# Patient Record
Sex: Male | Born: 1945 | Race: Black or African American | Hispanic: No | Marital: Married | Smoking: Former smoker
Health system: Southern US, Community
[De-identification: ages and names within clinical notes are randomized; demographics above are authoritative.]

## PROBLEM LIST (undated history)

## (undated) DIAGNOSIS — Z8719 Personal history of other diseases of the digestive system: Secondary | ICD-10-CM

## (undated) DIAGNOSIS — I10 Essential (primary) hypertension: Secondary | ICD-10-CM

## (undated) DIAGNOSIS — Z8546 Personal history of malignant neoplasm of prostate: Secondary | ICD-10-CM

## (undated) HISTORY — DX: Personal history of other diseases of the digestive system: Z87.19

---

## 1998-12-27 ENCOUNTER — Encounter: Payer: Self-pay | Admitting: Emergency Medicine

## 1998-12-27 ENCOUNTER — Emergency Department (HOSPITAL_COMMUNITY): Admission: EM | Admit: 1998-12-27 | Discharge: 1998-12-27 | Payer: Self-pay | Admitting: Emergency Medicine

## 2006-10-30 ENCOUNTER — Encounter: Payer: Self-pay | Admitting: Internal Medicine

## 2006-10-30 ENCOUNTER — Ambulatory Visit: Payer: Self-pay | Admitting: Internal Medicine

## 2006-10-30 ENCOUNTER — Ambulatory Visit (HOSPITAL_COMMUNITY): Admission: RE | Admit: 2006-10-30 | Discharge: 2006-10-30 | Payer: Self-pay | Admitting: Internal Medicine

## 2008-11-15 ENCOUNTER — Emergency Department (HOSPITAL_COMMUNITY): Admission: EM | Admit: 2008-11-15 | Discharge: 2008-11-15 | Payer: Self-pay | Admitting: Emergency Medicine

## 2009-05-07 HISTORY — PX: PROSTATE SURGERY: SHX751

## 2009-09-06 ENCOUNTER — Ambulatory Visit (HOSPITAL_COMMUNITY): Admission: RE | Admit: 2009-09-06 | Discharge: 2009-09-06 | Payer: Self-pay | Admitting: Urology

## 2009-11-14 ENCOUNTER — Inpatient Hospital Stay (HOSPITAL_COMMUNITY): Admission: RE | Admit: 2009-11-14 | Discharge: 2009-11-15 | Payer: Self-pay | Admitting: Urology

## 2009-11-14 ENCOUNTER — Encounter (INDEPENDENT_AMBULATORY_CARE_PROVIDER_SITE_OTHER): Payer: Self-pay | Admitting: Urology

## 2010-07-23 LAB — CBC
Hemoglobin: 14.6 g/dL (ref 13.0–17.0)
MCH: 28 pg (ref 26.0–34.0)
MCV: 81.8 fL (ref 78.0–100.0)
RBC: 5.22 MIL/uL (ref 4.22–5.81)

## 2010-07-23 LAB — HEMOGLOBIN AND HEMATOCRIT, BLOOD
HCT: 35.4 % — ABNORMAL LOW (ref 39.0–52.0)
Hemoglobin: 12 g/dL — ABNORMAL LOW (ref 13.0–17.0)
Hemoglobin: 13 g/dL (ref 13.0–17.0)

## 2010-07-23 LAB — BASIC METABOLIC PANEL
CO2: 28 mEq/L (ref 19–32)
Chloride: 101 mEq/L (ref 96–112)
GFR calc Af Amer: >60 mL/min (ref >60)
Sodium: 137 mEq/L (ref 135–145)

## 2010-07-23 LAB — ABO/RH: ABO/RH(D): O POS

## 2010-07-23 LAB — SURGICAL PCR SCREEN
MRSA, PCR: NEGATIVE
Staphylococcus aureus: NEGATIVE

## 2010-08-13 LAB — CBC
MCV: 80.7 fL (ref 78.0–100.0)
Platelets: 184 10*3/uL (ref 150–400)
WBC: 8.5 10*3/uL (ref 4.0–10.5)

## 2010-08-13 LAB — POCT CARDIAC MARKERS: Myoglobin, poc: 43.4 ng/mL (ref 12–200)

## 2010-08-13 LAB — DIFFERENTIAL
Basophils Absolute: 0 10*3/uL (ref 0.0–0.1)
Eosinophils Absolute: 0.2 10*3/uL (ref 0.0–0.7)
Lymphs Abs: 1.6 10*3/uL (ref 0.7–4.0)
Neutrophils Relative %: 71 % (ref 43–77)

## 2010-08-13 LAB — BASIC METABOLIC PANEL
BUN: 8 mg/dL (ref 6–23)
Creatinine, Ser: 1.02 mg/dL (ref 0.4–1.5)
GFR calc non Af Amer: >60 mL/min (ref >60)

## 2010-09-10 IMAGING — CT CT HEAD W/O CM
1 series · 16 of 30 positions shown, 20 images · non-contrast
Comparison: None.

CLINICAL DATA: Dizzy.

CT HEAD WITHOUT CONTRAST
TECHNIQUE: Contiguous axial images were obtained from the base of
the skull through the vertex without contrast.

[Series 2: headseq 4.8 h37s · axial · 0.43mm/px · z∈[+80,+215]mm · 16 of 30 slices shown, 20 images]
[im 2/30  brain]
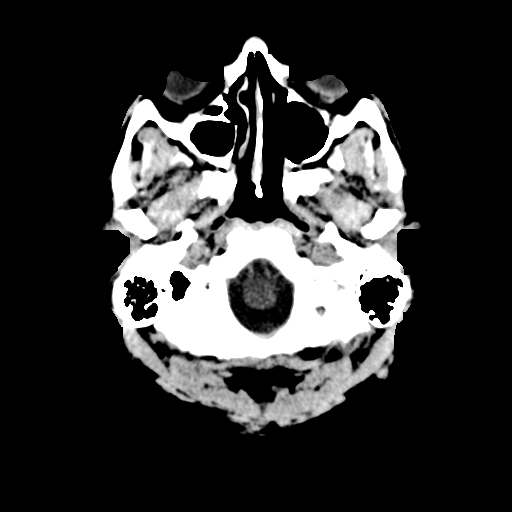
[im 2/30  bone]
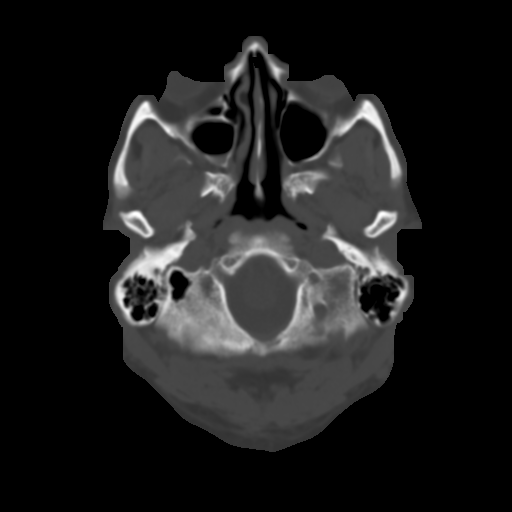
[im 4/30  brain]
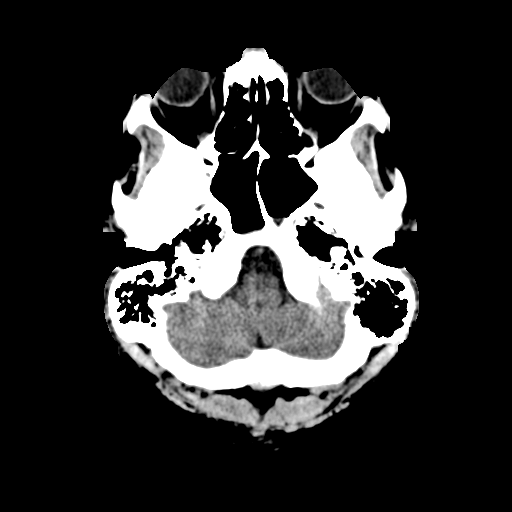
[im 6/30  brain]
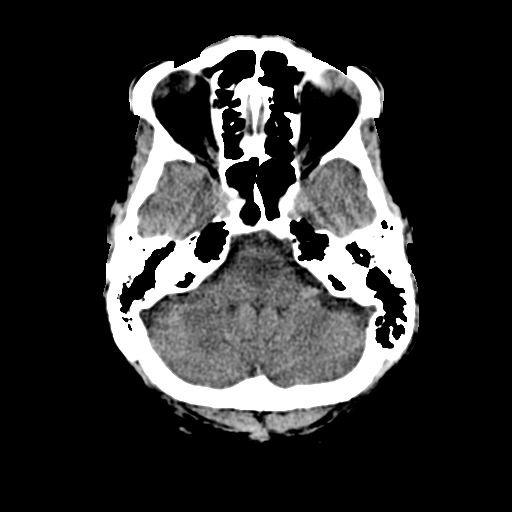
[im 8/30  brain]
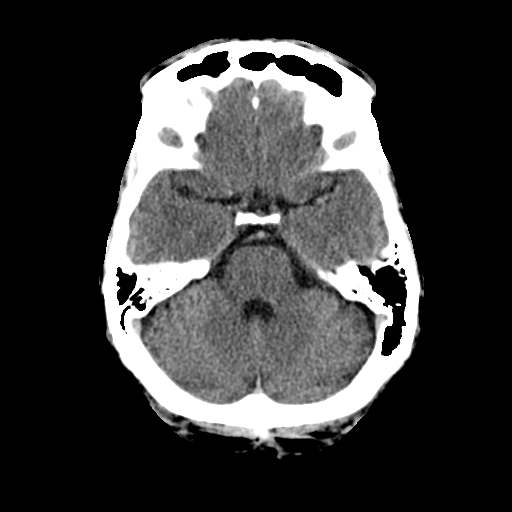
[im 9/30  brain]
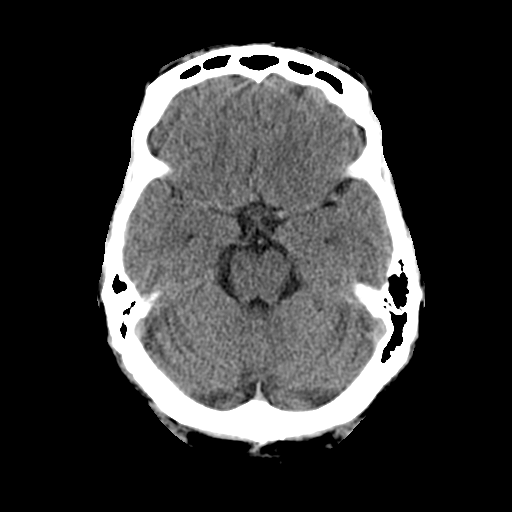
[im 9/30  bone]
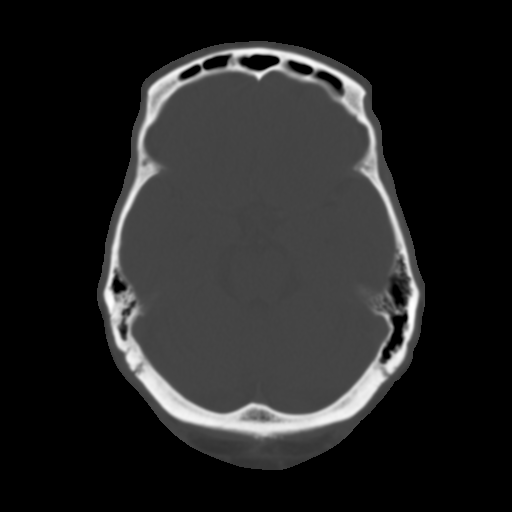
[im 11/30  brain]
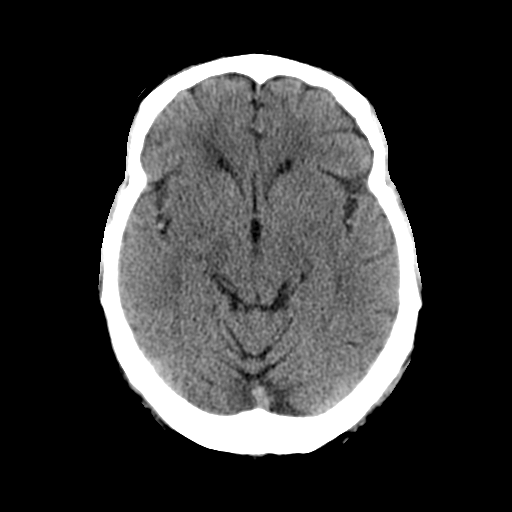
[im 13/30  brain]
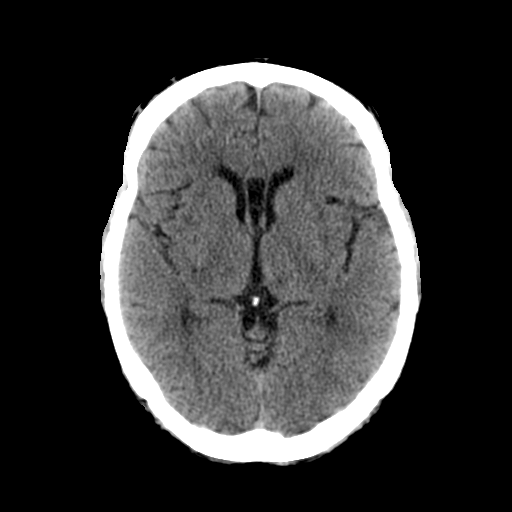
[im 15/30  brain]
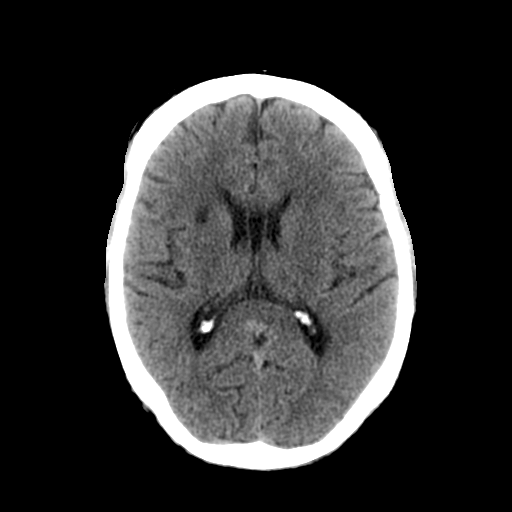
[im 16/30  brain]
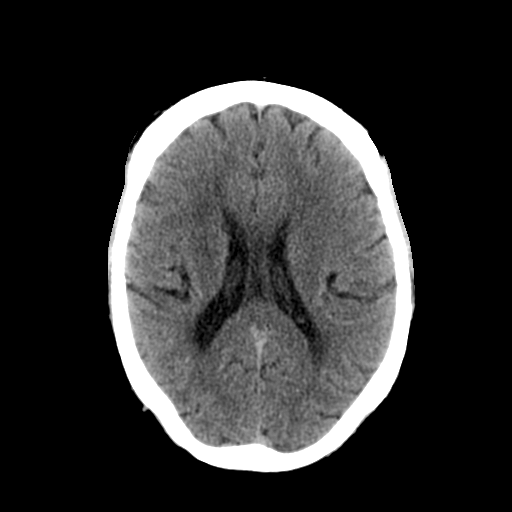
[im 16/30  bone]
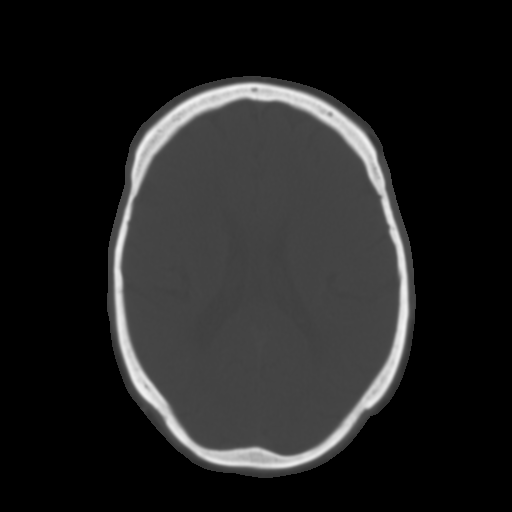
[im 18/30  brain]
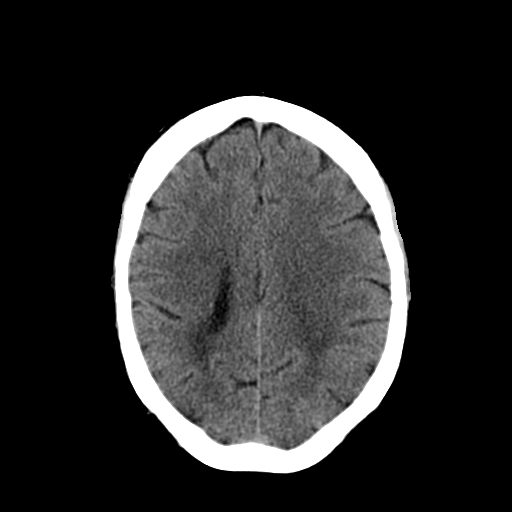
[im 20/30  brain]
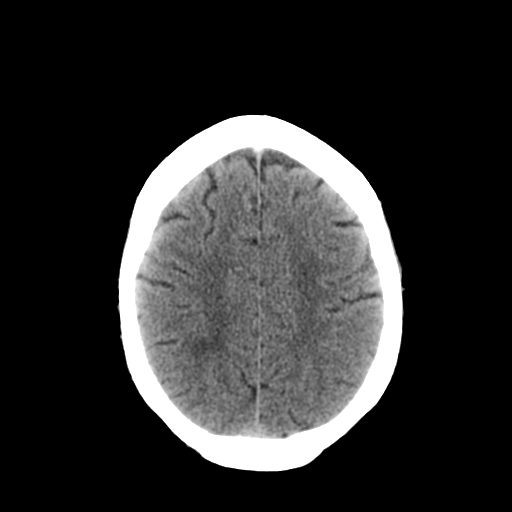
[im 22/30  brain]
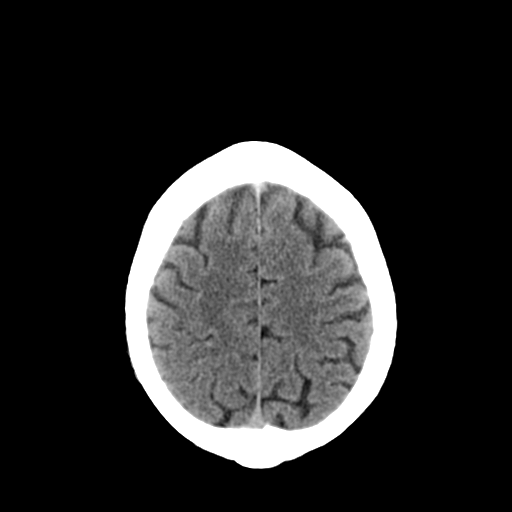
[im 23/30  brain]
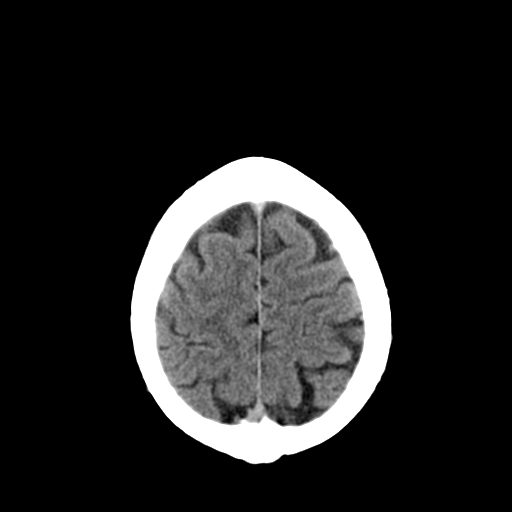
[im 23/30  bone]
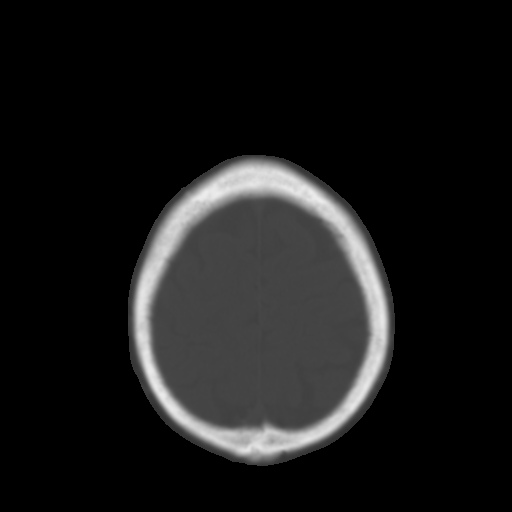
[im 25/30  brain]
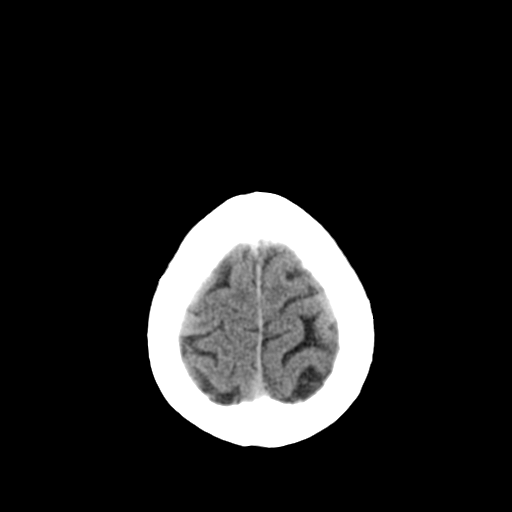
[im 27/30  brain]
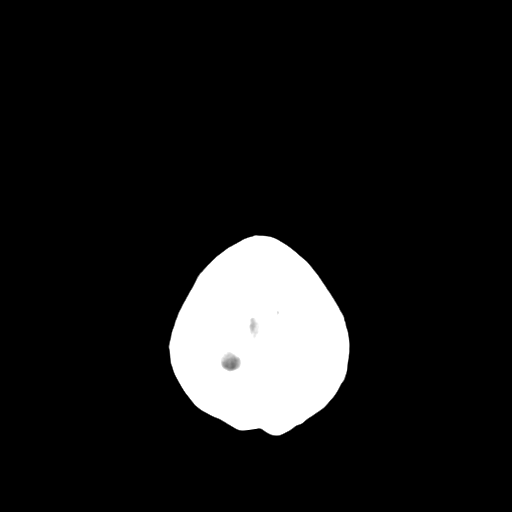
[im 29/30  brain]
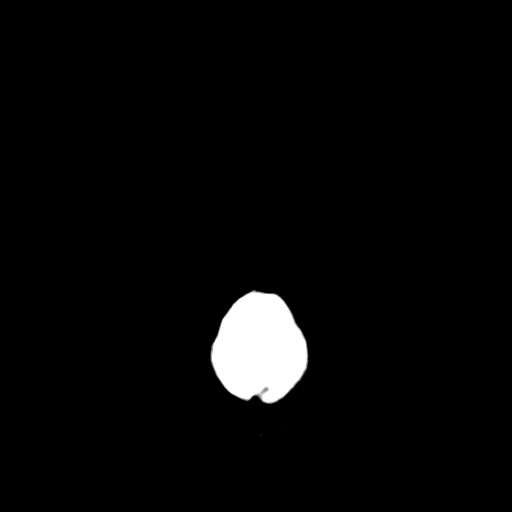

[16 of 30 positions shown; findings below may reference images not displayed]

FINDINGS: No intracranial hemorrhage. Cava septum lucent
incidentally noted.  Mild age related atrophy without
hydrocephalus.

Anterior right corona radiata and bilateral thalamic hypodensities
suggestive of result of infarcts, age indeterminate.

Hypodensity right parietal region probably related to result of
small vessel disease.  Less notable white matter type changes
paraventricular region. No intracranial mass detected on this
unenhanced exam.

Mild mucosal thickening right maxillary sinus and sphenoid sinus
air cells with small air-fluid level.
IMPRESSION: No intracranial hemorrhage.

Infarcts within the thalami and anterior right corona radiata, age
indeterminate.

Small vessel disease type changes most notable right parietal
region.

Very mild paranasal sinus disease.

## 2010-09-19 NOTE — Op Note (Signed)
NAME:  Antonio Hughes, Antonio Hughes          ACCOUNT NO.:  [id number]   MEDICAL RECORD NO.:  [id number]          PATIENT TYPE:  AMB   LOCATION:  DAY                           FACILITY:  [facility]   PHYSICIAN:  R. Roetta Sessions, M.D. DATE OF BIRTH:  [birth year]   DATE OF PROCEDURE:  10/30/2006  DATE OF DISCHARGE:                               OPERATIVE REPORT   PROCEDURE:  Screening colonoscopy/colonoscopy with biopsy.   INDICATIONS FOR PROCEDURE:  The patient is a pleasant 65 year old  African American sent over at the courtesy Dr. Sudie Bailey, who has no  lower GI tract symptoms, for screening colonoscopy.  Antonio Hughes has  never had his lower GI tract evaluated.  There is no family history of  colorectal neoplasia.  Colonoscopy is now being done as a screening  maneuver.  This approach has been discussed with the patient at length.  Potential risks, benefits and alternatives have been reviewed.  His  questions were answered and he is agreeable.  Please see documentation  in the medical record.   PROCEDURE NOTE:  O2 saturation, blood pressure, pulse and respirations  were monitored throughout the entire procedure.   CONSCIOUS SEDATION:  Versed 2 mg IV, Demerol 50 mg IV.   INSTRUMENT:  Pentax video chip system.   FINDINGS:  Digital rectal exam revealed no abnormalities.   ENDOSCOPIC FINDINGS:  The prep was excellent.  Examination of the  colonic mucosa was undertaken from the rectosigmoid junction through the  left transverse, right colon to the area of the appendiceal orifice,  ileocecal valve and cecum.  These structures well seen and photographed  for the record.  From this level, the scope was slowly withdrawn.  All  previously mentioned mucosal surfaces were again seen.  The patient had  a diminutive (2 mm) polyp in the mid ascending colon and a second one in  the mid descending colon, both of these were cold biopsy/removed.  The  patient had a few scattered shallow mouth diverticula in the  sigmoid  colon, remainder of the colonic mucosa appeared normal.  The scope was  pulled down in the rectum where thorough examination of the rectal  mucosa including retroflexed view of the anal verge demonstrated no  abnormalities.  The patient tolerated procedure well as reactive to  endoscopy.   IMPRESSION:  1. Normal rectum.  2. A few scattered shallow sigmoid diverticula.  3. Two diminutive polyps, one each in the left and ascending segments,      cold biopsy/removed.  Remainder colonic mucosa appeared normal.   RECOMMENDATIONS:  1. Diverticulosis literature provided to Antonio Hughes  2. Follow-up on path.  3. Further recommendations to follow.      Jonathon Bellows, M.D.  Electronically Signed     RMR/MEDQ  D:  10/30/2006  T:  10/30/2006  Job:  [id number]   cc:   Mila Homer. Sudie Bailey, M.D.  Fax: [phone]

## 2011-10-15 ENCOUNTER — Encounter: Payer: Self-pay | Admitting: Internal Medicine

## 2012-03-27 ENCOUNTER — Other Ambulatory Visit (HOSPITAL_COMMUNITY)
Admission: RE | Admit: 2012-03-27 | Discharge: 2012-03-27 | Disposition: A | Discharge status: Home / Self Care | Payer: Medicare Other | Source: Ambulatory Visit | Attending: Otolaryngology | Admitting: Otolaryngology

## 2012-03-27 ENCOUNTER — Ambulatory Visit (INDEPENDENT_AMBULATORY_CARE_PROVIDER_SITE_OTHER): Payer: Medicare Other | Admitting: Otolaryngology

## 2012-03-27 DIAGNOSIS — L918 Other hypertrophic disorders of the skin: Secondary | ICD-10-CM | POA: Insufficient documentation

## 2012-03-27 DIAGNOSIS — K0381 Cracked tooth: Secondary | ICD-10-CM | POA: Insufficient documentation

## 2012-03-27 DIAGNOSIS — D3701 Neoplasm of uncertain behavior of lip: Secondary | ICD-10-CM

## 2012-03-27 DIAGNOSIS — K1370 Unspecified lesions of oral mucosa: Secondary | ICD-10-CM | POA: Insufficient documentation

## 2012-11-09 ENCOUNTER — Encounter (HOSPITAL_COMMUNITY): Payer: Self-pay

## 2012-11-09 ENCOUNTER — Emergency Department (HOSPITAL_COMMUNITY)
Admission: EM | Admit: 2012-11-09 | Discharge: 2012-11-09 | Disposition: A | Discharge status: Home / Self Care | Payer: Medicare Other | Attending: Emergency Medicine | Admitting: Emergency Medicine

## 2012-11-09 DIAGNOSIS — Z859 Personal history of malignant neoplasm, unspecified: Secondary | ICD-10-CM | POA: Insufficient documentation

## 2012-11-09 DIAGNOSIS — R590 Localized enlarged lymph nodes: Secondary | ICD-10-CM

## 2012-11-09 DIAGNOSIS — Z9889 Other specified postprocedural states: Secondary | ICD-10-CM | POA: Insufficient documentation

## 2012-11-09 DIAGNOSIS — R599 Enlarged lymph nodes, unspecified: Secondary | ICD-10-CM | POA: Insufficient documentation

## 2012-11-09 DIAGNOSIS — Z8679 Personal history of other diseases of the circulatory system: Secondary | ICD-10-CM | POA: Insufficient documentation

## 2012-11-09 DIAGNOSIS — I10 Essential (primary) hypertension: Secondary | ICD-10-CM | POA: Insufficient documentation

## 2012-11-09 HISTORY — DX: Essential (primary) hypertension: I10

## 2012-11-09 MED ORDER — SULFAMETHOXAZOLE-TMP DS 800-160 MG PO TABS
1.0000 | ORAL_TABLET | Freq: Once | ORAL | Status: AC
  Administered 2012-11-09: 1 via ORAL
  Filled 2012-11-09: qty 1

## 2012-11-09 MED ORDER — SULFAMETHOXAZOLE-TRIMETHOPRIM 800-160 MG PO TABS: 1.0000 | ORAL_TABLET | Freq: Two times a day (BID) | ORAL | Status: DC

## 2012-11-09 NOTE — ED Notes (Signed)
Complain of sores on legs, dental pain and groin swelling.

## 2012-11-09 NOTE — ED Provider Notes (Signed)
History  This chart was scribed for Antonio Hutching, MD by Bennett Scrape, ED Scribe. This patient was seen in room APA02/APA02 and the patient's care was started at 11:02 AM.  CSN: [id number]  Arrival date & time 11/09/12  1029   First MD Initiated Contact with Patient 11/09/12 1102     Chief Complaint  Patient presents with  . Groin Swelling    The history is provided by the patient. No language interpreter was used.    HPI Comments: Antonio Hughes is a 67 y.o. male who presents to the Emergency Department complaining of gradual onset, constant left groin swelling with associated pain described as soreness that he attributes to a possible swollen lymph node that he noticed this morning. He denies any modifying factors, including ambulation and bending over. He denies having symptoms yesterday but states that the swelling has been felt intermittently over the past month. He denies having pain with the swelling until today. He states that he spoke with his PCP about the symptoms last month for the same and was told that it was a swollen lymph node. He also c/o 3 to 4 sores on the left ankle but denies fevers, constipation, nausea, emesis and urinary symptoms as associated symptoms. He denies having any DM diagnoses and states that he had his blood glucose measured within normal limits within the past year.   Past Medical History  Diagnosis Date  . Cancer   . Hypertension    Past Surgical History  Procedure Laterality Date  . Prostate surgery     No family history on file. History  Substance Use Topics  . Smoking status: Never Smoker   . Smokeless tobacco: Not on file  . Alcohol Use: No    Review of Systems  A complete 10 system review of systems was obtained and all systems are negative except as noted in the HPI and PMH.   Allergies  Review of patient's allergies indicates no known allergies.  Home Medications  No current outpatient prescriptions on file.  Triage  Vitals: BP 138/79  Pulse 70  Temp(Src) 98.1 F (36.7 C) (Oral)  Resp 18  Ht 5\' 6"  (1.676 m)  Wt 140 lb (63.504 kg)  BMI 22.61 kg/m2  SpO2 98%  Physical Exam  Nursing note and vitals reviewed. Constitutional: He is oriented to person, place, and time. He appears well-developed and well-nourished.  HENT:  Head: Normocephalic and atraumatic.  Eyes: Conjunctivae and EOM are normal. Pupils are equal, round, and reactive to light.  Neck: Normal range of motion. Neck supple.  Cardiovascular: Normal rate, regular rhythm and normal heart sounds.   Pulmonary/Chest: Effort normal and breath sounds normal.  Abdominal: Soft. Bowel sounds are normal.  Genitourinary:  2 by 2 cm lymph node in the medial aspect of the left groin that was tender to palpation, chaperone present  Musculoskeletal: Normal range of motion.  Neurological: He is alert and oriented to person, place, and time.  Skin: Skin is warm and dry.  3 to 4 satellite areas that appear to be minor abscesses approximately 0.5 cm in diameter on the lower left leg  Psychiatric: He has a normal mood and affect.    ED Course  Procedures (including critical care time)  DIAGNOSTIC STUDIES: Oxygen Saturation is 98% on room air, normal by my interpretation.    COORDINATION OF CARE: 12:24 PM- Advised pt that symptoms are most likely a swollen lymph node. Discussed discharge plan which includes antibiotics and warm compress with  pt and pt agreed to plan. Also advised pt to follow up as needed and pt agreed. Addressed symptoms to return for with pt.   Labs Reviewed - No data to display No results found.  No diagnosis found.  MDM  History and physical consistent with left inguinal adenitis. Source may have been infected insect bites in left lower extremity. Does not appear to be a hernia. Rx Septra DS twice a day for 10 days. Followup primary care Dr.   I personally performed the services described in this documentation, which was scribed  in my presence. The recorded information has been reviewed and is accurate.    Antonio Hutching, MD 11/09/12 1322

## 2013-05-12 ENCOUNTER — Encounter (HOSPITAL_COMMUNITY)
Admission: RE | Admit: 2013-05-12 | Discharge: 2013-05-12 | Disposition: A | Discharge status: Home / Self Care | Payer: Medicare HMO | Source: Ambulatory Visit | Attending: General Surgery | Admitting: General Surgery

## 2013-05-12 ENCOUNTER — Other Ambulatory Visit: Payer: Self-pay

## 2013-05-12 ENCOUNTER — Encounter (HOSPITAL_COMMUNITY): Payer: Self-pay

## 2013-05-12 HISTORY — DX: Personal history of malignant neoplasm of prostate: Z85.46

## 2013-05-12 LAB — CBC WITH DIFFERENTIAL/PLATELET
Basophils Absolute: 0 10*3/uL (ref 0.0–0.1)
Basophils Relative: 0 % (ref 0–1)
Eosinophils Absolute: 0.2 10*3/uL (ref 0.0–0.7)
Eosinophils Relative: 3 % (ref 0–5)
HEMATOCRIT: 40.9 % (ref 39.0–52.0)
HEMOGLOBIN: 14.9 g/dL (ref 13.0–17.0)
LYMPHS ABS: 1.9 10*3/uL (ref 0.7–4.0)
Lymphocytes Relative: 26 % (ref 12–46)
MCH: 28.2 pg (ref 26.0–34.0)
MCHC: 36.4 g/dL — ABNORMAL HIGH (ref 30.0–36.0)
MCV: 77.3 fL — ABNORMAL LOW (ref 78.0–100.0)
MONO ABS: 0.5 10*3/uL (ref 0.1–1.0)
MONOS PCT: 7 % (ref 3–12)
NEUTROS ABS: 4.8 10*3/uL (ref 1.7–7.7)
NEUTROS PCT: 64 % (ref 43–77)
Platelets: 238 10*3/uL (ref 150–400)
RBC: 5.29 MIL/uL (ref 4.22–5.81)
RDW: 13.7 % (ref 11.5–15.5)
WBC: 7.4 10*3/uL (ref 4.0–10.5)

## 2013-05-12 LAB — BASIC METABOLIC PANEL
BUN: 11 mg/dL (ref 6–23)
CHLORIDE: 102 meq/L (ref 96–112)
CO2: 27 meq/L (ref 19–32)
Calcium: 10.2 mg/dL (ref 8.4–10.5)
Creatinine, Ser: 1.17 mg/dL (ref 0.50–1.35)
GFR calc Af Amer: 73 mL/min — ABNORMAL LOW (ref >90)
GFR calc non Af Amer: 63 mL/min — ABNORMAL LOW (ref >90)
GLUCOSE: 90 mg/dL (ref 70–99)
POTASSIUM: 4.7 meq/L (ref 3.7–5.3)
Sodium: 140 mEq/L (ref 137–147)

## 2013-05-12 NOTE — Patient Instructions (Addendum)
SABURO LUGER  05/12/2013   Your procedure is scheduled on:  05/14/13  Report to [facility] at 10:00 AM.  Call this number if you have problems the morning of surgery: [phone]   Remember:   Do not eat food or drink liquids after midnight.   Take these medicines the morning of surgery with A SIP OF WATER: Diltiazem   Do not wear jewelry, make-up or nail polish.  Do not wear lotions, powders, or perfumes.   Do not shave 48 hours prior to surgery. Men may shave face and neck.  Do not bring valuables to the hospital.  [facility] is not responsible for any belongings or valuables.               Contacts, dentures or bridgework may not be worn into surgery.  Leave suitcase in the car. After surgery it may be brought to your room.  For patients admitted to the hospital, discharge time is determined by your treatment team.               Patients discharged the day of surgery will not be allowed to drive home.   Special Instructions: Shower using CHG 2 nights before surgery and the night before surgery.  If you shower the day of surgery use CHG.  Use special wash - you have one bottle of CHG for all showers.  You should use approximately 1/3 of the bottle for each shower.   Please read over the following fact sheets that you were given: Pain Booklet, Surgical Site Infection Prevention, Anesthesia Post-op Instructions and Care and Recovery After Surgery    Inguinal Hernia, Adult Muscles help keep everything in the body in its proper place. But if a weak spot in the muscles develops, something can poke through. That is called a hernia. When this happens in the lower part of the belly (abdomen), it is called an inguinal hernia. (It takes its name from a part of the body in this region called the inguinal canal.) A weak spot in the wall of muscles lets some fat or part of the small intestine bulge through. An inguinal hernia can develop at any age. Men get them more often than  women. CAUSES  In adults, an inguinal hernia develops over time.  It can be triggered by:  Suddenly straining the muscles of the lower abdomen.  Lifting heavy objects.  Straining to have a bowel movement. Difficult bowel movements (constipation) can lead to this.  Constant coughing. This may be caused by smoking or lung disease.  Being overweight.  Being pregnant.  Working at a job that requires long periods of standing or heavy lifting.  Having had an inguinal hernia before. One type can be an emergency situation. It is called a strangulated inguinal hernia. It develops if part of the small intestine slips through the weak spot and cannot get back into the abdomen. The blood supply can be cut off. If that happens, part of the intestine may die. This situation requires emergency surgery. SYMPTOMS  Often, a small inguinal hernia has no symptoms. It is found when a healthcare provider does a physical exam. Larger hernias usually have symptoms.   In adults, symptoms may include:  A lump in the groin. This is easier to see when the person is standing. It might disappear when lying down.  In men, a lump in the scrotum.  Pain or burning in the groin. This occurs especially when lifting, straining or coughing.  A  dull ache or feeling of pressure in the groin.  Signs of a strangulated hernia can include:  A bulge in the groin that becomes very painful and tender to the touch.  A bulge that turns red or purple.  Fever, nausea and vomiting.  Inability to have a bowel movement or to pass gas. DIAGNOSIS  To decide if you have an inguinal hernia, a healthcare provider will probably do a physical examination.  This will include asking questions about any symptoms you have noticed.  The healthcare provider might feel the groin area and ask you to cough. If an inguinal hernia is felt, the healthcare provider may try to slide it back into the abdomen.  Usually no other tests are  needed. TREATMENT  Treatments can vary. The size of the hernia makes a difference. Options include:  Watchful waiting. This is often suggested if the hernia is small and you have had no symptoms.  No medical procedure will be done unless symptoms develop.  You will need to watch closely for symptoms. If any occur, contact your healthcare provider right away.  Surgery. This is used if the hernia is larger or you have symptoms.  Open surgery. This is usually an outpatient procedure (you will not stay overnight in a hospital). An cut (incision) is made through the skin in the groin. The hernia is put back inside the abdomen. The weak area in the muscles is then repaired by herniorrhaphy or hernioplasty. Herniorrhaphy: in this type of surgery, the weak muscles are sewn back together. Hernioplasty: a patch or mesh is used to close the weak area in the abdominal wall.  Laparoscopy. In this procedure, a surgeon makes small incisions. A thin tube with a tiny video camera (called a laparoscope) is put into the abdomen. The surgeon repairs the hernia with mesh by looking with the video camera and using two long instruments. HOME CARE INSTRUCTIONS   After surgery to repair an inguinal hernia:  You will need to take pain medicine prescribed by your healthcare provider. Follow all directions carefully.  You will need to take care of the wound from the incision.  Your activity will be restricted for awhile. This will probably include no heavy lifting for several weeks. You also should not do anything too active for a few weeks. When you can return to work will depend on the type of job that you have.  During "watchful waiting" periods, you should:  Maintain a healthy weight.  Eat a diet high in fiber (fruits, vegetables and whole grains).  Drink plenty of fluids to avoid constipation. This means drinking enough water and other liquids to keep your urine clear or pale yellow.  Do not lift heavy  objects.  Do not stand for long periods of time.  Quit smoking. This should keep you from developing a frequent cough. SEEK MEDICAL CARE IF:   A bulge develops in your groin area.  You feel pain, a burning sensation or pressure in the groin. This might be worse if you are lifting or straining.  You develop a fever of more than 100.5 F (38.1 C). SEEK IMMEDIATE MEDICAL CARE IF:   Pain in the groin increases suddenly.  A bulge in the groin gets bigger suddenly and does not go down.  For men, there is sudden pain in the scrotum. Or, the size of the scrotum increases.  A bulge in the groin area becomes red or purple and is painful to touch.  You have nausea or  vomiting that does not go away.  You feel your heart beating much faster than normal.  You cannot have a bowel movement or pass gas.  You develop a fever of more than 102.0 F (38.9 C). Document Released: 09/09/2008 Document Revised: 07/16/2011 Document Reviewed: 09/09/2008 [facility] Patient Information 2014 [place], [place].    PATIENT INSTRUCTIONS POST-ANESTHESIA  IMMEDIATELY FOLLOWING SURGERY:  Do not drive or operate machinery for the first twenty four hours after surgery.  Do not make any important decisions for twenty four hours after surgery or while taking narcotic pain medications or sedatives.  If you develop intractable nausea and vomiting or a severe headache please notify your doctor immediately.  FOLLOW-UP:  Please make an appointment with your surgeon as instructed. You do not need to follow up with anesthesia unless specifically instructed to do so.  WOUND CARE INSTRUCTIONS (if applicable):  Keep a dry clean dressing on the anesthesia/puncture wound site if there is drainage.  Once the wound has quit draining you may leave it open to air.  Generally you should leave the bandage intact for twenty four hours unless there is drainage.  If the epidural site drains for more than 36-48 hours please call the  anesthesia department.  QUESTIONS?:  Please feel free to call your physician or the hospital operator if you have any questions, and they will be happy to assist you.

## 2013-05-14 ENCOUNTER — Encounter (HOSPITAL_COMMUNITY): Payer: Self-pay | Admitting: *Deleted

## 2013-05-14 ENCOUNTER — Encounter (HOSPITAL_COMMUNITY): Payer: Medicare HMO | Admitting: Anesthesiology

## 2013-05-14 ENCOUNTER — Ambulatory Visit (HOSPITAL_COMMUNITY)
Admission: RE | Admit: 2013-05-14 | Discharge: 2013-05-15 | Disposition: A | Discharge status: Home / Self Care | Payer: Medicare HMO | Source: Ambulatory Visit | Attending: General Surgery | Admitting: General Surgery

## 2013-05-14 ENCOUNTER — Encounter (HOSPITAL_COMMUNITY)
Admission: RE | Disposition: A | Discharge status: Home / Self Care | Payer: Self-pay | Source: Ambulatory Visit | Attending: General Surgery

## 2013-05-14 ENCOUNTER — Ambulatory Visit (HOSPITAL_COMMUNITY): Payer: Medicare HMO | Admitting: Anesthesiology

## 2013-05-14 DIAGNOSIS — K4090 Unilateral inguinal hernia, without obstruction or gangrene, not specified as recurrent: Secondary | ICD-10-CM | POA: Diagnosis present

## 2013-05-14 DIAGNOSIS — Z01812 Encounter for preprocedural laboratory examination: Secondary | ICD-10-CM | POA: Insufficient documentation

## 2013-05-14 HISTORY — PX: INGUINAL HERNIA REPAIR: SHX194

## 2013-05-14 SURGERY — REPAIR, HERNIA, INGUINAL, ADULT
Anesthesia: General | Laterality: Right

## 2013-05-14 MED ORDER — CEFAZOLIN SODIUM 1-5 GM-% IV SOLN
1.0000 g | Freq: Once | INTRAVENOUS | Status: AC
  Administered 2013-05-14: 1 g via INTRAVENOUS

## 2013-05-14 MED ORDER — ONDANSETRON HCL 4 MG PO TABS: 4.0000 mg | ORAL_TABLET | Freq: Four times a day (QID) | ORAL | Status: DC | PRN

## 2013-05-14 MED ORDER — ONDANSETRON HCL 4 MG/2ML IJ SOLN: 4.0000 mg | Freq: Four times a day (QID) | INTRAMUSCULAR | Status: DC | PRN

## 2013-05-14 MED ORDER — ONDANSETRON HCL 4 MG/2ML IJ SOLN: 4.0000 mg | Freq: Once | INTRAMUSCULAR | Status: DC | PRN

## 2013-05-14 MED ORDER — BUPIVACAINE HCL (PF) 0.5 % IJ SOLN
INTRAMUSCULAR | Status: DC | PRN
Start: 2013-05-14 — End: 2013-05-14
  Administered 2013-05-14: 6 mL

## 2013-05-14 MED ORDER — SODIUM CHLORIDE 0.9 % IR SOLN
Status: DC | PRN
  Administered 2013-05-14: 1000 mL

## 2013-05-14 MED ORDER — PROPOFOL 10 MG/ML IV EMUL
INTRAVENOUS | Status: AC
  Filled 2013-05-14: qty 20

## 2013-05-14 MED ORDER — FENTANYL CITRATE 0.05 MG/ML IJ SOLN
25.0000 ug | INTRAMUSCULAR | Status: AC
  Administered 2013-05-14 (×2): 25 ug via INTRAVENOUS

## 2013-05-14 MED ORDER — FENTANYL CITRATE 0.05 MG/ML IJ SOLN
INTRAMUSCULAR | Status: AC
  Filled 2013-05-14: qty 2

## 2013-05-14 MED ORDER — POTASSIUM CHLORIDE IN NACL 20-0.9 MEQ/L-% IV SOLN
INTRAVENOUS | Status: DC
  Administered 2013-05-14 – 2013-05-15 (×2): via INTRAVENOUS

## 2013-05-14 MED ORDER — CHLORTHALIDONE 25 MG PO TABS
12.5000 mg | ORAL_TABLET | ORAL | Status: DC
  Administered 2013-05-14: 12.5 mg via ORAL
  Filled 2013-05-14 (×3): qty 0.5

## 2013-05-14 MED ORDER — MIDAZOLAM HCL 2 MG/2ML IJ SOLN
INTRAMUSCULAR | Status: AC
  Filled 2013-05-14: qty 2

## 2013-05-14 MED ORDER — MIDAZOLAM HCL 2 MG/2ML IJ SOLN
1.0000 mg | INTRAMUSCULAR | Status: DC | PRN
  Administered 2013-05-14: 2 mg via INTRAVENOUS

## 2013-05-14 MED ORDER — DILTIAZEM HCL ER COATED BEADS 180 MG PO CP24
360.0000 mg | ORAL_CAPSULE | Freq: Every day | ORAL | Status: DC
  Administered 2013-05-15: 360 mg via ORAL
  Filled 2013-05-14: qty 2

## 2013-05-14 MED ORDER — CEFAZOLIN SODIUM 1-5 GM-% IV SOLN
INTRAVENOUS | Status: AC
  Filled 2013-05-14: qty 50

## 2013-05-14 MED ORDER — LACTATED RINGERS IV SOLN
INTRAVENOUS | Status: DC
  Administered 2013-05-14 (×2): via INTRAVENOUS

## 2013-05-14 MED ORDER — STERILE WATER FOR IRRIGATION IR SOLN
Status: DC | PRN
  Administered 2013-05-14 (×2): 1000 mL

## 2013-05-14 MED ORDER — SULFAMETHOXAZOLE-TMP DS 800-160 MG PO TABS
1.0000 | ORAL_TABLET | Freq: Two times a day (BID) | ORAL | Status: DC
  Administered 2013-05-14 – 2013-05-15 (×2): 1 via ORAL
  Filled 2013-05-14 (×2): qty 1

## 2013-05-14 MED ORDER — DOCUSATE SODIUM 100 MG PO CAPS
100.0000 mg | ORAL_CAPSULE | Freq: Every day | ORAL | Status: DC
  Administered 2013-05-14 – 2013-05-15 (×2): 100 mg via ORAL
  Filled 2013-05-14 (×2): qty 1

## 2013-05-14 MED ORDER — LIDOCAINE HCL (CARDIAC) 10 MG/ML IV SOLN
INTRAVENOUS | Status: DC | PRN
  Administered 2013-05-14: 10 mg via INTRAVENOUS

## 2013-05-14 MED ORDER — POTASSIUM CHLORIDE CRYS ER 10 MEQ PO TBCR
10.0000 meq | EXTENDED_RELEASE_TABLET | Freq: Every day | ORAL | Status: DC
  Administered 2013-05-14 – 2013-05-15 (×2): 10 meq via ORAL
  Filled 2013-05-14 (×2): qty 1

## 2013-05-14 MED ORDER — BUPIVACAINE HCL (PF) 0.5 % IJ SOLN
INTRAMUSCULAR | Status: AC
  Filled 2013-05-14: qty 30

## 2013-05-14 MED ORDER — MORPHINE SULFATE 2 MG/ML IJ SOLN
1.0000 mg | INTRAMUSCULAR | Status: DC | PRN
Start: 2013-05-14 — End: 2013-05-15
  Administered 2013-05-14: 1 mg via INTRAVENOUS
  Filled 2013-05-14: qty 1

## 2013-05-14 MED ORDER — PROPOFOL 10 MG/ML IV BOLUS
INTRAVENOUS | Status: DC | PRN
  Administered 2013-05-14: 140 mg via INTRAVENOUS
  Administered 2013-05-14: 60 mg via INTRAVENOUS

## 2013-05-14 MED ORDER — FENTANYL CITRATE 0.05 MG/ML IJ SOLN
INTRAMUSCULAR | Status: DC | PRN
  Administered 2013-05-14: 12.5 ug via INTRAVENOUS
  Administered 2013-05-14 (×3): 25 ug via INTRAVENOUS
  Administered 2013-05-14: 12.5 ug via INTRAVENOUS

## 2013-05-14 MED ORDER — FENTANYL CITRATE 0.05 MG/ML IJ SOLN
25.0000 ug | INTRAMUSCULAR | Status: DC | PRN
  Administered 2013-05-14: 50 ug via INTRAVENOUS
  Filled 2013-05-14: qty 2

## 2013-05-14 SURGICAL SUPPLY — 51 items
ATTRACTOMAT 16X20 MAGNETIC DRP (DRAPES) ×3 IMPLANT
BAG HAMPER (MISCELLANEOUS) ×3 IMPLANT
CLOTH BEACON ORANGE TIMEOUT ST (SAFETY) ×3 IMPLANT
COVER LIGHT HANDLE STERIS (MISCELLANEOUS) ×6 IMPLANT
DECANTER SPIKE VIAL GLASS SM (MISCELLANEOUS) ×3 IMPLANT
DRAIN PENROSE 12X.25 LTX STRL (MISCELLANEOUS) ×3 IMPLANT
DRSG MEPILEX BORDER 4X8 (GAUZE/BANDAGES/DRESSINGS) IMPLANT
DRSG TEGADERM 4X4.75 (GAUZE/BANDAGES/DRESSINGS) ×2 IMPLANT
ELECT REM PT RETURN 9FT ADLT (ELECTROSURGICAL) ×3
ELECTRODE REM PT RTRN 9FT ADLT (ELECTROSURGICAL) ×1 IMPLANT
FORMALIN 10 PREFIL 120ML (MISCELLANEOUS) ×1 IMPLANT
GLOVE BIOGEL PI IND STRL 7.0 (GLOVE) IMPLANT
GLOVE BIOGEL PI INDICATOR 7.0 (GLOVE) ×4
GLOVE ECLIPSE 6.5 STRL STRAW (GLOVE) ×2 IMPLANT
GLOVE ECLIPSE 7.0 STRL STRAW (GLOVE) ×2 IMPLANT
GLOVE SKINSENSE NS SZ7.0 (GLOVE) ×2
GLOVE SKINSENSE STRL SZ7.0 (GLOVE) ×1 IMPLANT
GLOVE SS BIOGEL STRL SZ 6.5 (GLOVE) IMPLANT
GLOVE SUPERSENSE BIOGEL SZ 6.5 (GLOVE) ×2
GOWN STRL REIN XL XLG (GOWN DISPOSABLE) ×5 IMPLANT
GOWN STRL REUS W/ TWL LRG LVL3 (GOWN DISPOSABLE) IMPLANT
GOWN STRL REUS W/ TWL XL LVL3 (GOWN DISPOSABLE) IMPLANT
GOWN STRL REUS W/TWL LRG LVL3 (GOWN DISPOSABLE) ×6
GOWN STRL REUS W/TWL XL LVL3 (GOWN DISPOSABLE) ×3
INST SET MINOR GENERAL (KITS) ×3 IMPLANT
KIT ROOM TURNOVER APOR (KITS) ×3 IMPLANT
MANIFOLD NEPTUNE II (INSTRUMENTS) ×3 IMPLANT
NDL HYPO 25X1 1.5 SAFETY (NEEDLE) ×1 IMPLANT
NEEDLE HYPO 25X1 1.5 SAFETY (NEEDLE) IMPLANT
NS IRRIG 1000ML POUR BTL (IV SOLUTION) ×3 IMPLANT
PACK MINOR (CUSTOM PROCEDURE TRAY) ×3 IMPLANT
PAD ARMBOARD 7.5X6 YLW CONV (MISCELLANEOUS) ×3 IMPLANT
PAD TELFA 3X4 1S STER (GAUZE/BANDAGES/DRESSINGS) ×2 IMPLANT
SET BASIN LINEN APH (SET/KITS/TRAYS/PACK) ×3 IMPLANT
SOL PREP PROV IODINE SCRUB 4OZ (MISCELLANEOUS) ×3 IMPLANT
SPONGE GAUZE 4X4 12PLY (GAUZE/BANDAGES/DRESSINGS) ×3 IMPLANT
SPONGE INTESTINAL PEANUT (DISPOSABLE) ×3 IMPLANT
SPONGE LAP 18X18 X RAY DECT (DISPOSABLE) ×3 IMPLANT
STAPLER VISISTAT 35W (STAPLE) ×3 IMPLANT
SUT NOVA NAB GS-22 2 2-0 T-19 (SUTURE) IMPLANT
SUT NUROLON NAB CT 2 2-0 18IN (SUTURE) ×2 IMPLANT
SUT PROLENE 0 CT 1 CR/8 (SUTURE) IMPLANT
SUT SILK 2 0 (SUTURE) ×3
SUT SILK 2-0 18XBRD TIE 12 (SUTURE) ×1 IMPLANT
SUT VIC AB 3-0 SH 27 (SUTURE) ×3
SUT VIC AB 3-0 SH 27X BRD (SUTURE) ×1 IMPLANT
SUT VICRYL AB 3 0 TIES (SUTURE) ×3 IMPLANT
SYR BULB IRRIGATION 50ML (SYRINGE) ×3 IMPLANT
SYR CONTROL 10ML LL (SYRINGE) ×3 IMPLANT
TRAY FOLEY CATH 16FR SILVER (SET/KITS/TRAYS/PACK) ×1 IMPLANT
WATER STERILE IRR 1000ML POUR (IV SOLUTION) ×6 IMPLANT

## 2013-05-14 NOTE — Progress Notes (Signed)
Post Op Check  Filed Vitals:   05/14/13 1459  BP: 139/74  Pulse: 53  Temp: 97.5 F (36.4 C)  Resp:    Doing well post op.  Wound clean and dry. Has not voided yet but is not distended.  Pain under control. Will discharge in AM.

## 2013-05-14 NOTE — Progress Notes (Signed)
Admit 73 yr. Old African American for elective repair of  RIH.  Procedure and risks discussed and informed consent obtained.  Labs reviewed and surgical site marked.  No clinical change since H&P.  Dict. # [id number].  Filed Vitals:   05/14/13 1010  BP: 134/86  Pulse: 64  Temp: 97.7 F (36.5 C)  O2 sat 100% .

## 2013-05-14 NOTE — Anesthesia Postprocedure Evaluation (Signed)
Anesthesia Post Note  Patient: Antonio Hughes  Procedure(s) Performed: Procedure(s) (LRB): HERNIA REPAIR INGUINAL ADULT (Right)  Anesthesia type: General  Patient location: PACU  Post pain: Pain level controlled  Post assessment: Post-op Vital signs reviewed, Patient's Cardiovascular Status Stable, Respiratory Function Stable, Patent Airway, No signs of Nausea or vomiting and Pain level controlled  Last Vitals:  Filed Vitals:   05/14/13 1218  BP: 134/94  Pulse: 60  Temp: 36.4 C  Resp: 12    Post vital signs: Reviewed and stable  Level of consciousness: awake and alert   Complications: No apparent anesthesia complications

## 2013-05-14 NOTE — Transfer of Care (Signed)
Immediate Anesthesia Transfer of Care Note  Patient: Antonio Hughes  Procedure(s) Performed: Procedure(s) (LRB): HERNIA REPAIR INGUINAL ADULT (Right)  Patient Location: PACU  Anesthesia Type: General  Level of Consciousness: awake  Airway & Oxygen Therapy: Patient Spontanous Breathing and non-rebreather face mask  Post-op Assessment: Report given to PACU RN, Post -op Vital signs reviewed and stable and Patient moving all extremities  Post vital signs: Reviewed and stable  Complications: No apparent anesthesia complications

## 2013-05-14 NOTE — Anesthesia Preprocedure Evaluation (Signed)
Anesthesia Evaluation  Patient identified by MRN, date of birth, ID band Patient awake    Reviewed: Allergy & Precautions, H&P , NPO status , Patient's Chart, lab work & pertinent test results  Airway Mallampati: I TM Distance: >3 FB     Dental  (+) Teeth Intact   Pulmonary former smoker,  breath sounds clear to auscultation        Cardiovascular hypertension, Pt. on medications Rhythm:Regular Rate:Normal     Neuro/Psych    GI/Hepatic negative GI ROS,   Endo/Other    Renal/GU      Musculoskeletal   Abdominal   Peds  Hematology   Anesthesia Other Findings   Reproductive/Obstetrics                           Anesthesia Physical Anesthesia Plan  ASA: II  Anesthesia Plan: General   Post-op Pain Management:    Induction: Intravenous  Airway Management Planned: LMA  Additional Equipment:   Intra-op Plan:   Post-operative Plan: Extubation in OR  Informed Consent: I have reviewed the patients History and Physical, chart, labs and discussed the procedure including the risks, benefits and alternatives for the proposed anesthesia with the patient or authorized representative who has indicated his/her understanding and acceptance.     Plan Discussed with:   Anesthesia Plan Comments:         Anesthesia Quick Evaluation

## 2013-05-14 NOTE — Brief Op Note (Signed)
05/14/2013  12:23 PM  PATIENT:  Antonio Hughes  68 y.o. male  PRE-OPERATIVE DIAGNOSIS:  right inguinal hernia   POST-OPERATIVE DIAGNOSIS:  right inguinal hernia   PROCEDURE:  Procedure(s): HERNIA REPAIR INGUINAL ADULT (Right)  SURGEON:  Surgeon(s) and Role:    * Scherry Ran, MD - Primary  PHYSICIAN ASSISTANT:   ASSISTANTS: none   ANESTHESIA:   general  EBL:  Total I/O In: 1000 [I.V.:1000] Out: 10 [Blood:10]  BLOOD ADMINISTERED:none  DRAINS: none   LOCAL MEDICATIONS USED:  MARCAINE   0.5% `10cc.  SPECIMEN:  Source of Specimen:  right inguinal hernia sac.  DISPOSITION OF SPECIMEN:  PATHOLOGY  COUNTS:  YES  TOURNIQUET:  * No tourniquets in log *  DICTATION: .Other Dictation: Dictation Number OR dict. # [id number].  PLAN OF CARE: Admit for overnight observation  PATIENT DISPOSITION:  PACU - hemodynamically stable.   Delay start of Pharmacological VTE agent (>24hrs) due to surgical blood loss or risk of bleeding: not applicable

## 2013-05-14 NOTE — OR Nursing (Signed)
Up to bathroom to void 

## 2013-05-14 NOTE — Anesthesia Procedure Notes (Signed)
Procedure Name: LMA Insertion Date/Time: 05/14/2013 11:12 AM Performed by: Vista Deck Pre-anesthesia Checklist: Patient identified, Patient being monitored, Emergency Drugs available, Timeout performed and Suction available Patient Re-evaluated:Patient Re-evaluated prior to inductionOxygen Delivery Method: Circle System Utilized Preoxygenation: Pre-oxygenation with 100% oxygen Intubation Type: IV induction Ventilation: Mask ventilation without difficulty LMA: LMA inserted LMA Size: 4.0 Number of attempts: 1 Placement Confirmation: positive ETCO2 and breath sounds checked- equal and bilateral Tube secured with: Tape

## 2013-05-15 MED ORDER — DSS 100 MG PO CAPS: 100.0000 mg | ORAL_CAPSULE | Freq: Every day | ORAL | Status: AC

## 2013-05-15 NOTE — Op Note (Signed)
NAME:  Antonio Hughes, Antonio Hughes NO.:  [id number]  MEDICAL RECORD NO.:  [id number]  LOCATION:  A313                          FACILITY:  [facility]  PHYSICIAN:  Felicie Morn, M.D. DATE OF BIRTH:  11/05/1945  DATE OF PROCEDURE:  05/14/2013 DATE OF DISCHARGE:                              OPERATIVE REPORT   PREOPERATIVE DIAGNOSIS:  Right inguinal hernia (indirect and direct).  POSTOPERATIVE DIAGNOSIS:  Right inguinal hernia (indirect and direct).  PROCEDURE:  Right inguinal herniorrhaphy (modified McVay repair without the use of mesh).  SPECIMEN:  Right inguinal hernia sac.  WOUND CLASSIFICATION:  Clean.  GROSS OPERATIVE FINDINGS:  The patient's findings consistent with a right inguinal hernia, both indirect and direct.  No other abnormalities were encountered.  TECHNIQUE:  The patient was placed in supine position.  After the adequate administration of general anesthesia, he was prepped with Betadine solution and draped in usual manner.  No Foley catheter was placed as this patient had a previous prostatectomy and I did not want to risk this and the hernia was small enough that I did not think that we would have problems with the linked up procedure and the swelling bladder.  TECHNIQUE:  An incision was carried out between the anterior superior iliac spine and the pubic tubercle through skin and subcutaneous tissue down to the external oblique, which was opened through the external ring.  The cord structures were then separated from the hernia sac and the hernia sac was ligated doubly with 2-0 Bralon under direct vision. We then closed the direct defect suturing transversus abdominis and transversalis fascia to Cooper ligament and Poupart ligament with interrupted 2-0 Bralon sutures.  Prior to cinching these tightly, a relaxing incision was carried out.  The cord structures were in the ilioinguinal nerve which were preserved, during the dissection were returned to  their anatomic positions and the external oblique was repaired over the cord structures using a running 3-0 Vicryl suture. Subcu was irrigated with normal saline solution.  I used approximately 10 mL 0.5% Sensorcaine without epinephrine, and the wound was then closed with a stapling device.  The sterile dressing was applied.  No drains were placed and there were no complications.  The patient was taken to recovery room in satisfactory condition.     Felicie Morn, M.D.     WB/MEDQ  D:  05/14/2013  T:  05/15/2013  Job:  [id number]  cc:   Estill Bamberg. Karie Kirks, M.D. Fax: [phone]

## 2013-05-15 NOTE — Progress Notes (Signed)
POD # 1  Filed Vitals:   05/15/13 0535  BP: 116/71  Pulse: 66  Temp: 98.5 F (36.9 C)  Resp: 19    Wound clean and redressed.   Minimal discomfort.  Home with extra strength tylenol.  Voiding well and he has no leg pain or dysuria or chest pain.  Discharge and follow up arranged.  Dict.# [id number].

## 2013-05-15 NOTE — Anesthesia Postprocedure Evaluation (Signed)
  Anesthesia Post-op Note  Patient: Antonio Hughes  Procedure(s) Performed: Procedure(s): HERNIA REPAIR INGUINAL ADULT (Right)  Patient Location: Room 313  Anesthesia Type:General  Level of Consciousness: awake, alert , oriented and patient cooperative  Airway and Oxygen Therapy: Patient Spontanous Breathing  Post-op Pain: mild  Post-op Assessment: Post-op Vital signs reviewed, Patient's Cardiovascular Status Stable, Respiratory Function Stable, Patent Airway, No signs of Nausea or vomiting and Pain level controlled  Post-op Vital Signs: Reviewed and stable  Complications: No apparent anesthesia complications

## 2013-05-16 NOTE — Discharge Summary (Signed)
NAMEMarland Kitchen  TAYO, MAUTE NO.:  [id number]  MEDICAL RECORD NO.:  [id number]  LOCATION:  A313                          FACILITY:  [facility]  PHYSICIAN:  Felicie Morn, M.D. DATE OF BIRTH:  1946/03/09  DATE OF ADMISSION:  05/14/2013 DATE OF DISCHARGE:  01/09/2015LH                              DISCHARGE SUMMARY   DIAGNOSIS:  Right inguinal hernia.  PROCEDURE:  Right inguinal herniorrhaphy (modified McVay repair without mesh).  This was done on May 14, 2013.  NOTE:  This is a 68 year old African American who was referred by Dr. Karie Kirks for repair of a right inguinal hernia.  He was cleared for surgery medically and we performed this via the outpatient department without incident.  He was discharged on first postoperative day after period of observation.  He was able to void without any problems.  He had a previous laparoscopic prostatectomy; however, this was not a problem postoperatively.  At the time of discharge, his wound was clean. He had no leg pain, shortness of breath, or dysuria, and we explained his followup and wound care in detail, and we will follow up perioperatively with him regarding this and he is to return to Dr. Karie Kirks for any medical problems he may have in the future.     Felicie Morn, M.D.     WB/MEDQ  D:  05/15/2013  T:  05/16/2013  Job:  [id number]  cc:   Estill Bamberg. Karie Kirks, M.D. Fax: [phone]

## 2013-05-18 ENCOUNTER — Encounter (HOSPITAL_COMMUNITY): Payer: Self-pay | Admitting: General Surgery

## 2014-12-01 ENCOUNTER — Emergency Department (HOSPITAL_COMMUNITY)
Admission: EM | Admit: 2014-12-01 | Discharge: 2014-12-01 | Disposition: A | Discharge status: Home / Self Care | Payer: PPO | Attending: Emergency Medicine | Admitting: Emergency Medicine

## 2014-12-01 ENCOUNTER — Encounter (HOSPITAL_COMMUNITY): Payer: Self-pay

## 2014-12-01 DIAGNOSIS — Y998 Other external cause status: Secondary | ICD-10-CM | POA: Insufficient documentation

## 2014-12-01 DIAGNOSIS — T50901A Poisoning by unspecified drugs, medicaments and biological substances, accidental (unintentional), initial encounter: Secondary | ICD-10-CM

## 2014-12-01 DIAGNOSIS — Z7982 Long term (current) use of aspirin: Secondary | ICD-10-CM | POA: Diagnosis not present

## 2014-12-01 DIAGNOSIS — Z79899 Other long term (current) drug therapy: Secondary | ICD-10-CM | POA: Insufficient documentation

## 2014-12-01 DIAGNOSIS — T452X1A Poisoning by vitamins, accidental (unintentional), initial encounter: Secondary | ICD-10-CM | POA: Diagnosis not present

## 2014-12-01 DIAGNOSIS — I10 Essential (primary) hypertension: Secondary | ICD-10-CM | POA: Insufficient documentation

## 2014-12-01 DIAGNOSIS — Y9389 Activity, other specified: Secondary | ICD-10-CM | POA: Diagnosis not present

## 2014-12-01 DIAGNOSIS — Z87891 Personal history of nicotine dependence: Secondary | ICD-10-CM | POA: Diagnosis not present

## 2014-12-01 DIAGNOSIS — Z8546 Personal history of malignant neoplasm of prostate: Secondary | ICD-10-CM | POA: Insufficient documentation

## 2014-12-01 DIAGNOSIS — T426X1A Poisoning by other antiepileptic and sedative-hypnotic drugs, accidental (unintentional), initial encounter: Secondary | ICD-10-CM | POA: Diagnosis not present

## 2014-12-01 DIAGNOSIS — Y9289 Other specified places as the place of occurrence of the external cause: Secondary | ICD-10-CM | POA: Insufficient documentation

## 2014-12-01 NOTE — ED Notes (Signed)
Pt reports accidentally took his wife's medications this morning instead of his.  Says he took 1 divalproex er 500mg , vimpat 50mg  and biotin 5020mcg.    PT says took them around 1050 am.  Pt says feels fine at this time.

## 2014-12-01 NOTE — ED Provider Notes (Signed)
CSN: [id number]     Arrival date & time 12/01/14  1103 History  This chart was scribed for Milton Ferguson, MD by Randa Evens, ED Scribe. This patient was seen in room APA09/APA09 and the patient's care was started at 11:39 AM.     Chief Complaint  Patient presents with  . Ingestion   Patient is a 69 y.o. male presenting with Ingested Medication. The history is provided by the patient. No language interpreter was used.  Ingestion This is a new problem. The current episode started 1 to 2 hours ago. The problem occurs rarely. Pertinent negatives include no chest pain, no abdominal pain and no headaches. Nothing aggravates the symptoms. Nothing relieves the symptoms. He has tried nothing for the symptoms.   HPI Comments: Antonio Hughes is a 69 y.o. male who presents to the Emergency Department complaining of ingestion of his wife medications this morning at 10:50 AM. Pt states that he accidentally took his wife medications. Pt states that he took 1 divalproex er 500mg , vimpat 50mg  and biotin 5060mcg. Pt states that he feels fine at this time. Pt states that he did try to vomit the medications back up but was unsuccessful.   Past Medical History  Diagnosis Date  . Hypertension   . H/O prostate cancer    Past Surgical History  Procedure Laterality Date  . Prostate surgery  2011  . Inguinal hernia repair Right 05/14/2013    Procedure: HERNIA REPAIR INGUINAL ADULT;  Surgeon: Scherry Ran, MD;  Location: [facility];  Service: General;  Laterality: Right;   No family history on file. History  Substance Use Topics  . Smoking status: Former Smoker -- 5.00 packs/day for 10 years    Types: Cigarettes  . Smokeless tobacco: Not on file  . Alcohol Use: No    Review of Systems  Constitutional: Negative for appetite change and fatigue.  HENT: Negative for congestion, ear discharge and sinus pressure.   Eyes: Negative for discharge.  Respiratory: Negative for cough.   Cardiovascular:  Negative for chest pain.  Gastrointestinal: Negative for abdominal pain and diarrhea.  Genitourinary: Negative for frequency and hematuria.  Musculoskeletal: Negative for back pain.  Skin: Negative for rash.  Neurological: Negative for seizures and headaches.  Psychiatric/Behavioral: Negative for hallucinations.      Allergies  Review of patient's allergies indicates no known allergies.  Home Medications   Prior to Admission medications   Medication Sig Start Date End Date Taking? Authorizing Provider  aspirin 81 MG tablet Take 81 mg by mouth daily.   Yes Historical Provider, MD  chlorthalidone (HYGROTON) 25 MG tablet Take 12.5 mg by mouth every other day.   Yes Historical Provider, MD  diltiazem (CARDIZEM CD) 180 MG 24 hr capsule Take 360 mg by mouth daily.   Yes Historical Provider, MD  docusate sodium 100 MG CAPS Take 100 mg by mouth daily. 05/15/13  Yes Felicie Morn, MD  Multiple Vitamin (MULTIVITAMIN WITH MINERALS) TABS tablet Take 1 tablet by mouth daily.   Yes Historical Provider, MD  potassium chloride (K-DUR,KLOR-CON) 10 MEQ tablet Take 10 mEq by mouth daily.   Yes Historical Provider, MD   BP 133/89 mmHg  Pulse 79  Temp(Src) 98 F (36.7 C) (Oral)  Resp 20  Ht 5\' 5"  (1.651 m)  Wt 135 lb (61.236 kg)  BMI 22.47 kg/m2  SpO2 99%   Physical Exam  Constitutional: He is oriented to person, place, and time. He appears well-developed.  HENT:  Head: Normocephalic.  Eyes: Conjunctivae and EOM are normal. No scleral icterus.  Neck: Neck supple. No thyromegaly present.  Cardiovascular: Normal rate and regular rhythm.  Exam reveals no gallop and no friction rub.   No murmur heard. Pulmonary/Chest: No stridor. He has no wheezes. He has no rales. He exhibits no tenderness.  Abdominal: He exhibits no distension. There is no tenderness. There is no rebound.  Musculoskeletal: Normal range of motion. He exhibits no edema.  Lymphadenopathy:    He has no cervical adenopathy.   Neurological: He is oriented to person, place, and time. He exhibits normal muscle tone. Coordination normal.  Skin: No rash noted. No erythema.  Psychiatric: He has a normal mood and affect. His behavior is normal.  Nursing note and vitals reviewed.   ED Course  Procedures (including critical care time) DIAGNOSTIC STUDIES: Oxygen Saturation is 99% on RA, normal by my interpretation.    COORDINATION OF CARE: 11:42 AM-Discussed treatment plan with pt at bedside and pt agreed to plan.     Labs Review Labs Reviewed - No data to display  Imaging Review No results found.   EKG Interpretation   Date/Time:  Wednesday December 01 2014 12:00:28 EDT Ventricular Rate:  80 PR Interval:  149 QRS Duration: 86 QT Interval:  358 QTC Calculation: 413 R Axis:   31 Text Interpretation:  Sinus rhythm ST elevation, consider anterior injury  ,  unchanged from 1/15,  Confirmed by Satori Krabill  MD, Antonio Hughes [phone]) on  12/01/2014 12:49:24 PM     Poison control called and stated that the pt could be discharged home MDM   Final diagnoses:  None   Pt with accidental ingestion of 2 of his wifes pills.  Poison control stated no need for observation or tx.  Pt to follow up prn         Milton Ferguson, MD 12/01/14 1302

## 2014-12-01 NOTE — Discharge Instructions (Signed)
Call poison control at [phone] if any problems.  Follow up with your md as needed

## 2015-05-10 ENCOUNTER — Telehealth: Payer: Self-pay | Admitting: Nurse Practitioner

## 2015-05-10 ENCOUNTER — Ambulatory Visit: Payer: PPO | Admitting: Nurse Practitioner

## 2015-05-10 ENCOUNTER — Encounter: Payer: Self-pay | Admitting: Nurse Practitioner

## 2015-05-10 NOTE — Telephone Encounter (Signed)
PATIENT WAS A NO SHOW AND LETTER SENT  °

## 2015-05-10 NOTE — Telephone Encounter (Signed)
Noted  

## 2015-05-12 ENCOUNTER — Ambulatory Visit (INDEPENDENT_AMBULATORY_CARE_PROVIDER_SITE_OTHER): Payer: PPO

## 2015-05-12 ENCOUNTER — Ambulatory Visit (INDEPENDENT_AMBULATORY_CARE_PROVIDER_SITE_OTHER): Payer: PPO | Admitting: Orthopedic Surgery

## 2015-05-12 VITALS — BP 138/81 | Ht 65.0 in | Wt 142.0 lb

## 2015-05-12 DIAGNOSIS — M25511 Pain in right shoulder: Secondary | ICD-10-CM

## 2015-05-12 DIAGNOSIS — M75101 Unspecified rotator cuff tear or rupture of right shoulder, not specified as traumatic: Secondary | ICD-10-CM | POA: Diagnosis not present

## 2015-05-12 NOTE — Progress Notes (Signed)
Patient ID: Antonio Hughes, male   DOB: [birth year], 70 y.o.   MRN: [id number]  Chief Complaint  Patient presents with  . Follow-up    Rt shoulder pain, no known injury, referred by Dr Soundra Pilon is a 70 y.o. , malewho now presents with right shoulder pain   Antonio Hughes reports no injury. He thinks his long history of work may have aggravated his shoulder on the right. Mainly complains of clicking giving out mild weakness dull aching pain some and night but not divorced and 2 out of 10 and not requiring any medication. No previous treatment. Certain positions seem to aggravate it more these include overactivity had activity  Review of systems negative except for the joint pain  Review of Systems Review of Systems   Past Medical History  Diagnosis Date  . Hypertension   . H/O prostate cancer     Past Surgical History  Procedure Laterality Date  . Prostate surgery  2011  . Inguinal hernia repair Right 05/14/2013    Procedure: HERNIA REPAIR INGUINAL ADULT;  Surgeon: Scherry Ran, MD;  Location: [facility];  Service: General;  Laterality: Right;    No family history on file.  Social History Social History  Substance Use Topics  . Smoking status: Former Smoker -- 5.00 packs/day for 10 years    Types: Cigarettes  . Smokeless tobacco: Not on file  . Alcohol Use: No    No Known Allergies  Current Outpatient Prescriptions  Medication Sig Dispense Refill  . aspirin 81 MG tablet Take 81 mg by mouth daily.    . chlorthalidone (HYGROTON) 25 MG tablet Take 12.5 mg by mouth every other day.    . diltiazem (CARDIZEM CD) 180 MG 24 hr capsule Take 360 mg by mouth daily.    Marland Kitchen docusate sodium 100 MG CAPS Take 100 mg by mouth daily. 10 capsule 0  . Multiple Vitamin (MULTIVITAMIN WITH MINERALS) TABS tablet Take 1 tablet by mouth daily.    . potassium chloride (K-DUR,KLOR-CON) 10 MEQ tablet Take 10 mEq by mouth daily.     No current facility-administered  medications for this visit.       Physical Exam Blood pressure 138/81, height 5\' 5"  (1.651 m), weight 142 lb (64.411 kg). Physical Exam  Objective:     The patient is awake alert and oriented 3 her mood and affect are normal. She exhibits normal grooming and hygiene without gross deformity.  Gait is normal the noncontributory  On inspection of the right shoulder we find tenderness in the peri-acromial region. The patient exhibits decreased range of motion with forward elevation in the scapular plane. We also find a loss of internal rotation  The patient is stable in abduction external rotation  The internal and external rotators have normal strength we find mild weakness in the right rotator cuff supraspinatus tendon with the empty can sign  The skin is warm dry and intact without erythema laceration or previous incision.  Sensation remains normal and the patient has a normal pulse with good perfusion and a warm extremity to touch  Cervical spine is nontender with full range of motion  Comparison left shoulder examination reveals a normal range of motion normal strength no instability and no swelling    Data Reviewed My interpretation of the xrays:  Abnormality of the before meals joint but glenohumeral joint normal no previous surgery noted  Assessment  right shoulder pain rotator cuff syndrome Plan  subacromial  injection and home exercises with PEP pad   Procedure note the subacromial injection shoulder RIGHT  Verbal consent was obtained to inject the  RIGHT   Shoulder  Timeout was completed to confirm the injection site is a subacromial space of the  RIGHT  shoulder   Medication used Depo-Medrol 40 mg and lidocaine 1% 3 cc  Anesthesia was provided by ethyl chloride  The injection was performed in the RIGHT  posterior subacromial space. After preparing  the skin with alcohol and anesthetized the skin with ethyl chloride the subacromial space was injected using a  20-gauge needle. There were no complications  Sterile dressing was applied.

## 2015-07-04 ENCOUNTER — Ambulatory Visit (INDEPENDENT_AMBULATORY_CARE_PROVIDER_SITE_OTHER): Payer: PPO | Admitting: Orthopedic Surgery

## 2015-07-04 ENCOUNTER — Encounter: Payer: Self-pay | Admitting: Orthopedic Surgery

## 2015-07-04 VITALS — BP 133/81 | Ht 65.0 in | Wt 143.0 lb

## 2015-07-04 DIAGNOSIS — M25511 Pain in right shoulder: Secondary | ICD-10-CM

## 2015-07-04 DIAGNOSIS — M75101 Unspecified rotator cuff tear or rupture of right shoulder, not specified as traumatic: Secondary | ICD-10-CM

## 2015-07-04 NOTE — Progress Notes (Signed)
Patient ID: Antonio Hughes, male   DOB: [birth year], 70 y.o.   MRN: [id number]  Chief Complaint  Patient presents with  . Follow-up    follow up right shoulder    BP 133/81 mmHg  Ht 5\' 5"  (1.651 m)  Wt 143 lb (64.864 kg)  BMI 23.80 kg/m2  The patient had double we thought was a rotator cuff syndrome we treated him with injection home exercises he was having 2 out of 10 right shoulder pain which is still very mild  Review of systems no numbness or tingling he does report occasional increase in pain depending on position  The examination shows stable vital signs no tenderness or swelling around the shoulder he has full forward elevation shoulder is stable his supraspinatus tendon right to left is 3 out of 5 versus 5 out of 5 scans intact pulses are normal lymph nodes are negative    ASSESSMENT AND PLAN   Right shoulder rotator cuff syndrome with rotator cuff syndrome pain possible rotator cuff tear  As I discussed with him his shoulder is not bad enough right now to warrant surgery. We would do an MRI prior to surgery to see if the cuff is intact determine if he could have repair versus need for replacement  He is comfortable with this and return as needed basis

## 2015-10-17 ENCOUNTER — Encounter: Payer: Self-pay | Admitting: Internal Medicine

## 2015-10-17 DIAGNOSIS — C61 Malignant neoplasm of prostate: Secondary | ICD-10-CM | POA: Diagnosis not present

## 2015-10-17 DIAGNOSIS — K635 Polyp of colon: Secondary | ICD-10-CM | POA: Diagnosis not present

## 2015-10-17 DIAGNOSIS — I10 Essential (primary) hypertension: Secondary | ICD-10-CM | POA: Diagnosis not present

## 2015-10-17 DIAGNOSIS — Z8673 Personal history of transient ischemic attack (TIA), and cerebral infarction without residual deficits: Secondary | ICD-10-CM | POA: Diagnosis not present

## 2015-10-17 DIAGNOSIS — R739 Hyperglycemia, unspecified: Secondary | ICD-10-CM | POA: Diagnosis not present

## 2015-11-03 DIAGNOSIS — H25813 Combined forms of age-related cataract, bilateral: Secondary | ICD-10-CM | POA: Diagnosis not present

## 2015-11-17 ENCOUNTER — Ambulatory Visit (INDEPENDENT_AMBULATORY_CARE_PROVIDER_SITE_OTHER): Payer: PPO | Admitting: Nurse Practitioner

## 2015-11-17 ENCOUNTER — Encounter: Payer: Self-pay | Admitting: Nurse Practitioner

## 2015-11-17 ENCOUNTER — Other Ambulatory Visit: Payer: Self-pay

## 2015-11-17 VITALS — BP 114/77 | HR 70 | Temp 98.2°F | Ht 66.0 in | Wt 140.2 lb

## 2015-11-17 DIAGNOSIS — Z860100 Personal history of colon polyps, unspecified: Secondary | ICD-10-CM

## 2015-11-17 MED ORDER — PEG 3350-KCL-NA BICARB-NACL 420 G PO SOLR: 4000.0000 mL | ORAL | Status: DC

## 2015-11-17 NOTE — Assessment & Plan Note (Signed)
Patient with a history of colon polyps currently 4 years overdue for surveillance colonoscopy. His last colonoscopy was in 2008 and the record is not available in the EMR due to age, we will request this from [place] records. Surgical pathology suggested tubular adenoma with a recommended 5 year repeat exam (2013). He is currently asymptomatic from a GI standpoint. At this time we'll proceed with colonoscopy as recommended. Return for follow-up based on postprocedure recommendations.  Proceed with TCS with Dr. Gala Romney in near future: the risks, benefits, and alternatives have been discussed with the patient in detail. The patient states understanding and desires to proceed.  The patient is not on any anticoagulants, anxiolytics, chronic pain medications, or antidepressants. Conscious sedation should be adequate for his procedure.

## 2015-11-17 NOTE — Progress Notes (Signed)
Referring Provider: Lemmie Evens, MD Primary Care Physician:  Robert Bellow, MD Primary GI:  Dr. Gala Romney  Chief Complaint  Patient presents with  . Colonoscopy    Hx of polyps    HPI:   Antonio Hughes is a 70 y.o. male who presents on referral from primary care for overdue surveillance colonoscopy. His last colonoscopy was in 2008. The records are not currently available in our system due to the age but they will be requested. Per primary care office visit on 10/17/2015 the colonoscopy was "slightly abnormal" and he was due for repeat in 2013 which was not completed. There are also pathology results from 10/30/2006 for colon polyps one of which was adenoma and one was hyperplastic. There is a letter in the system that was mailed to the patient to home 10/15/2011 indicating time to schedule repeat colonoscopy. He had an office visit scheduled for January of this year but was a no-show. It was strongly suggested to him that he follow-up with GI for repeat colonoscopy.  Today he states he's doing well overall. Denies abdominal pain, N/V, hematochezia, melena, change in bowel habits, unintentional weight loss, fever, chills. Has regular bowel movements which are soft and pass easily. Denies chest pain, dyspnea, dizziness, lightheadedness, syncope, near syncope. Denies any other upper or lower GI symptoms.   Takes Cardizem for BP control, no history of AFib.  Past Medical History  Diagnosis Date  . Hypertension   . H/O prostate cancer     s/p surgical intervention  . H/O inguinal hernia     s/p repair    Past Surgical History  Procedure Laterality Date  . Prostate surgery  2011  . Inguinal hernia repair Right 05/14/2013    Procedure: HERNIA REPAIR INGUINAL ADULT;  Surgeon: Scherry Ran, MD;  Location: [facility];  Service: General;  Laterality: Right;    Current Outpatient Prescriptions  Medication Sig Dispense Refill  . aspirin 81 MG tablet Take 81 mg by mouth daily.     . chlorthalidone (HYGROTON) 25 MG tablet Take 12.5 mg by mouth every other day.    . diltiazem (CARDIZEM CD) 180 MG 24 hr capsule Take 360 mg by mouth daily.    Marland Kitchen docusate sodium 100 MG CAPS Take 100 mg by mouth daily. 10 capsule 0  . potassium chloride (K-DUR,KLOR-CON) 10 MEQ tablet Take 10 mEq by mouth daily.     No current facility-administered medications for this visit.    Allergies as of 11/17/2015  . (No Known Allergies)    Family History  Problem Relation Age of Onset  . Colon cancer Neg Hx   . Esophageal cancer Father     Social History   Social History  . Marital Status: Married    Spouse Name: N/A  . Number of Children: N/A  . Years of Education: N/A   Social History Main Topics  . Smoking status: Former Smoker -- 5.00 packs/day for 10 years    Types: Cigarettes    Quit date: 05/08/1971  . Smokeless tobacco: Never Used  . Alcohol Use: No  . Drug Use: No  . Sexual Activity: Not Asked   Other Topics Concern  . None   Social History Narrative    Review of Systems: General: Negative for anorexia, weight loss, fever, chills, fatigue, weakness. ENT: Negative for hoarseness, difficulty swallowing. CV: Negative for chest pain, angina, palpitations, peripheral edema.  Respiratory: Negative for dyspnea at rest, cough, sputum, wheezing.  GI: See history  of present illness. MS: Negative for joint pain, low back pain.  Derm: Negative for rash or itching.  Endo: Negative for unusual weight change.  Heme: Negative for bruising or bleeding. Allergy: Negative for rash or hives.   Physical Exam: BP 114/77 mmHg  Pulse 70  Temp(Src) 98.2 F (36.8 C) (Oral)  Ht 5\' 6"  (1.676 m)  Wt 140 lb 3.2 oz (63.594 kg)  BMI 22.64 kg/m2 General:   Alert and oriented. Pleasant and cooperative. Well-nourished and well-developed.  Head:  Normocephalic and atraumatic. Eyes:  Without icterus, sclera clear and conjunctiva pink.  Ears:  Normal auditory acuity. Cardiovascular:   S1, S2 present without murmurs appreciated. Extremities without clubbing or edema. Respiratory:  Clear to auscultation bilaterally. No wheezes, rales, or rhonchi. No distress.  Gastrointestinal:  +BS, rounded but soft, non-tender and non-distended. No HSM noted. No guarding or rebound. No masses appreciated.  Rectal:  Deferred  Musculoskalatal:  Symmetrical without gross deformities. Skin:  Intact without significant lesions or rashes. Neurologic:  Alert and oriented x4;  grossly normal neurologically. Psych:  Alert and cooperative. Normal mood and affect. Heme/Lymph/Immune: No excessive bruising noted.    11/17/2015 11:00 AM   Disclaimer: This note was dictated with voice recognition software. Similar sounding words can inadvertently be transcribed and may not be corrected upon review.

## 2015-11-17 NOTE — Patient Instructions (Signed)
1. We will schedule your procedure for you. 2. Further recommendations to be based on the results of your procedure. 

## 2015-11-18 NOTE — Progress Notes (Signed)
cc'ed to pcp °

## 2015-12-15 ENCOUNTER — Encounter (HOSPITAL_COMMUNITY)
Admission: RE | Disposition: A | Discharge status: Home / Self Care | Payer: Self-pay | Source: Ambulatory Visit | Attending: Internal Medicine

## 2015-12-15 ENCOUNTER — Encounter (HOSPITAL_COMMUNITY): Payer: Self-pay

## 2015-12-15 ENCOUNTER — Ambulatory Visit (HOSPITAL_COMMUNITY)
Admission: RE | Admit: 2015-12-15 | Discharge: 2015-12-15 | Disposition: A | Discharge status: Home / Self Care | Payer: PPO | Source: Ambulatory Visit | Attending: Internal Medicine | Admitting: Internal Medicine

## 2015-12-15 DIAGNOSIS — Z8546 Personal history of malignant neoplasm of prostate: Secondary | ICD-10-CM | POA: Insufficient documentation

## 2015-12-15 DIAGNOSIS — Z87891 Personal history of nicotine dependence: Secondary | ICD-10-CM | POA: Insufficient documentation

## 2015-12-15 DIAGNOSIS — Z86010 Personal history of colonic polyps: Secondary | ICD-10-CM | POA: Insufficient documentation

## 2015-12-15 DIAGNOSIS — Z79899 Other long term (current) drug therapy: Secondary | ICD-10-CM | POA: Diagnosis not present

## 2015-12-15 DIAGNOSIS — I10 Essential (primary) hypertension: Secondary | ICD-10-CM | POA: Diagnosis not present

## 2015-12-15 DIAGNOSIS — Z1211 Encounter for screening for malignant neoplasm of colon: Secondary | ICD-10-CM | POA: Insufficient documentation

## 2015-12-15 DIAGNOSIS — Z7982 Long term (current) use of aspirin: Secondary | ICD-10-CM | POA: Diagnosis not present

## 2015-12-15 HISTORY — PX: COLONOSCOPY: SHX5424

## 2015-12-15 SURGERY — COLONOSCOPY
Anesthesia: Moderate Sedation

## 2015-12-15 MED ORDER — MIDAZOLAM HCL 5 MG/5ML IJ SOLN
INTRAMUSCULAR | Status: DC | PRN
  Administered 2015-12-15: 2 mg via INTRAVENOUS

## 2015-12-15 MED ORDER — MIDAZOLAM HCL 5 MG/5ML IJ SOLN
INTRAMUSCULAR | Status: AC
  Filled 2015-12-15: qty 10

## 2015-12-15 MED ORDER — ONDANSETRON HCL 4 MG/2ML IJ SOLN
INTRAMUSCULAR | Status: AC
  Filled 2015-12-15: qty 2

## 2015-12-15 MED ORDER — MEPERIDINE HCL 100 MG/ML IJ SOLN
INTRAMUSCULAR | Status: DC | PRN
  Administered 2015-12-15: 50 mg via INTRAVENOUS

## 2015-12-15 MED ORDER — MEPERIDINE HCL 100 MG/ML IJ SOLN
INTRAMUSCULAR | Status: AC
  Filled 2015-12-15: qty 2

## 2015-12-15 MED ORDER — ONDANSETRON HCL 4 MG/2ML IJ SOLN
INTRAMUSCULAR | Status: DC | PRN
  Administered 2015-12-15: 4 mg via INTRAVENOUS

## 2015-12-15 MED ORDER — SIMETHICONE 40 MG/0.6ML PO SUSP
ORAL | Status: DC | PRN
  Administered 2015-12-15: 10:00:00

## 2015-12-15 MED ORDER — SODIUM CHLORIDE 0.9 % IV SOLN
INTRAVENOUS | Status: DC
  Administered 2015-12-15: 09:00:00 via INTRAVENOUS

## 2015-12-15 NOTE — Op Note (Signed)
[facility] Patient Name: Antonio Hughes Procedure Date: 12/15/2015 9:49 AM MRN: [id number] Date of Birth: 30-Nov-1945 Attending MD: Norvel Richards , MD CSN: [id number] Age: 70 Admit Type: Outpatient Procedure:                Colonoscopy - surveillance examination Indications:              High risk colon cancer surveillance: Personal                            history of colonic polyps Providers:                Norvel Richards, MD, Renda Rolls, RN, Randa Spike, Technician Referring MD:              Medicines:                Midazolam 2 mg IV, Meperidine 50 mg IV Complications:            No immediate complications. Estimated Blood Loss:     Estimated blood loss: none. Procedure:                Pre-Anesthesia Assessment:                           - Prior to the procedure, a History and Physical                            was performed, and patient medications and                            allergies were reviewed. The patient's tolerance of                            previous anesthesia was also reviewed. The risks                            and benefits of the procedure and the sedation                            options and risks were discussed with the patient.                            All questions were answered, and informed consent                            was obtained. Prior Anticoagulants: The patient has                            taken no previous anticoagulant or antiplatelet                            agents. ASA Grade Assessment: II - A patient with  mild systemic disease. After reviewing the risks                            and benefits, the patient was deemed in                            satisfactory condition to undergo the procedure.                           After obtaining informed consent, the colonoscope                            was passed under direct vision. Throughout the                     procedure, the patient's blood pressure, pulse, and                            oxygen saturations were monitored continuously. The                            [id number] scope was introduced through                            the anus and advanced to the the cecum, identified                            by appendiceal orifice and ileocecal valve. The                            colonoscopy was performed without difficulty. The                            patient tolerated the procedure well. The quality                            of the bowel preparation was adequate. The                            ileocecal valve, appendiceal orifice, and rectum                            were photographed. Scope In: 9:56:06 AM Scope Out: 10:09:49 AM Scope Withdrawal Time: 0 hours 9 minutes 48 seconds  Total Procedure Duration: 0 hours 13 minutes 43 seconds  Findings:      The perianal and digital rectal examinations were normal.      The colon (entire examined portion) appeared normal.      The entire examined colon appeared normal on direct and retroflexion       views. Impression:               - The entire examined colon is normal on direct and                            retroflexion views.                           -  No specimens collected. Moderate Sedation:      Moderate (conscious) sedation was administered by the endoscopy nurse       and supervised by the endoscopist. The following parameters were       monitored: oxygen saturation, heart rate, blood pressure, respiratory       rate, EKG, adequacy of pulmonary ventilation, and response to care.       Total physician intraservice time was 18 minutes. Recommendation:           - Patient has a contact number available for                            emergencies. The signs and symptoms of potential                            delayed complications were discussed with the                            patient. Return to normal  activities tomorrow.                            Written discharge instructions were provided to the                            patient.                           - Advance diet as tolerated.                           - Continue present medications. Given age, I do not                            recommend a future colonoscopy unless new symptoms                            develop.                           - No repeat colonoscopy due to age. Procedure Code(s):        --- Professional ---                           [phone], Colonoscopy, flexible; diagnostic, including                            collection of specimen(s) by brushing or washing,                            when performed (separate procedure)                           99152, Moderate sedation services provided by the                            same physician or other qualified health care  professional performing the diagnostic or                            therapeutic service that the sedation supports,                            requiring the presence of an independent trained                            observer to assist in the monitoring of the                            patient's level of consciousness and physiological                            status; initial 15 minutes of intraservice time,                            patient age 76 years or older Diagnosis Code(s):        --- Professional ---                           Z86.010, Personal history of colonic polyps CPT copyright 2016 [employer]. All rights reserved. The codes documented in this report are preliminary and upon coder review may  be revised to meet current compliance requirements. Cristopher Estimable. Rourk, MD Norvel Richards, MD 12/15/2015 10:17:46 AM This report has been signed electronically. Number of Addenda: 0

## 2015-12-15 NOTE — H&P (View-Only) (Signed)
Referring Provider: Lemmie Evens, MD Primary Care Physician:  Robert Bellow, MD Primary GI:  Dr. Gala Romney  Chief Complaint  Patient presents with  . Colonoscopy    Hx of polyps    HPI:   Antonio Hughes is a 70 y.o. male who presents on referral from primary care for overdue surveillance colonoscopy. His last colonoscopy was in 2008. The records are not currently available in our system due to the age but they will be requested. Per primary care office visit on 10/17/2015 the colonoscopy was "slightly abnormal" and he was due for repeat in 2013 which was not completed. There are also pathology results from 10/30/2006 for colon polyps one of which was adenoma and one was hyperplastic. There is a letter in the system that was mailed to the patient to home 10/15/2011 indicating time to schedule repeat colonoscopy. He had an office visit scheduled for January of this year but was a no-show. It was strongly suggested to him that he follow-up with GI for repeat colonoscopy.  Today he states he's doing well overall. Denies abdominal pain, N/V, hematochezia, melena, change in bowel habits, unintentional weight loss, fever, chills. Has regular bowel movements which are soft and pass easily. Denies chest pain, dyspnea, dizziness, lightheadedness, syncope, near syncope. Denies any other upper or lower GI symptoms.   Takes Cardizem for BP control, no history of AFib.  Past Medical History  Diagnosis Date  . Hypertension   . H/O prostate cancer     s/p surgical intervention  . H/O inguinal hernia     s/p repair    Past Surgical History  Procedure Laterality Date  . Prostate surgery  2011  . Inguinal hernia repair Right 05/14/2013    Procedure: HERNIA REPAIR INGUINAL ADULT;  Surgeon: Scherry Ran, MD;  Location: [facility];  Service: General;  Laterality: Right;    Current Outpatient Prescriptions  Medication Sig Dispense Refill  . aspirin 81 MG tablet Take 81 mg by mouth daily.     . chlorthalidone (HYGROTON) 25 MG tablet Take 12.5 mg by mouth every other day.    . diltiazem (CARDIZEM CD) 180 MG 24 hr capsule Take 360 mg by mouth daily.    Marland Kitchen docusate sodium 100 MG CAPS Take 100 mg by mouth daily. 10 capsule 0  . potassium chloride (K-DUR,KLOR-CON) 10 MEQ tablet Take 10 mEq by mouth daily.     No current facility-administered medications for this visit.    Allergies as of 11/17/2015  . (No Known Allergies)    Family History  Problem Relation Age of Onset  . Colon cancer Neg Hx   . Esophageal cancer Father     Social History   Social History  . Marital Status: Married    Spouse Name: N/A  . Number of Children: N/A  . Years of Education: N/A   Social History Main Topics  . Smoking status: Former Smoker -- 5.00 packs/day for 10 years    Types: Cigarettes    Quit date: 05/08/1971  . Smokeless tobacco: Never Used  . Alcohol Use: No  . Drug Use: No  . Sexual Activity: Not Asked   Other Topics Concern  . None   Social History Narrative    Review of Systems: General: Negative for anorexia, weight loss, fever, chills, fatigue, weakness. ENT: Negative for hoarseness, difficulty swallowing. CV: Negative for chest pain, angina, palpitations, peripheral edema.  Respiratory: Negative for dyspnea at rest, cough, sputum, wheezing.  GI: See history  of present illness. MS: Negative for joint pain, low back pain.  Derm: Negative for rash or itching.  Endo: Negative for unusual weight change.  Heme: Negative for bruising or bleeding. Allergy: Negative for rash or hives.   Physical Exam: BP 114/77 mmHg  Pulse 70  Temp(Src) 98.2 F (36.8 C) (Oral)  Ht 5\' 6"  (1.676 m)  Wt 140 lb 3.2 oz (63.594 kg)  BMI 22.64 kg/m2 General:   Alert and oriented. Pleasant and cooperative. Well-nourished and well-developed.  Head:  Normocephalic and atraumatic. Eyes:  Without icterus, sclera clear and conjunctiva pink.  Ears:  Normal auditory acuity. Cardiovascular:   S1, S2 present without murmurs appreciated. Extremities without clubbing or edema. Respiratory:  Clear to auscultation bilaterally. No wheezes, rales, or rhonchi. No distress.  Gastrointestinal:  +BS, rounded but soft, non-tender and non-distended. No HSM noted. No guarding or rebound. No masses appreciated.  Rectal:  Deferred  Musculoskalatal:  Symmetrical without gross deformities. Skin:  Intact without significant lesions or rashes. Neurologic:  Alert and oriented x4;  grossly normal neurologically. Psych:  Alert and cooperative. Normal mood and affect. Heme/Lymph/Immune: No excessive bruising noted.    11/17/2015 11:00 AM   Disclaimer: This note was dictated with voice recognition software. Similar sounding words can inadvertently be transcribed and may not be corrected upon review.

## 2015-12-15 NOTE — Interval H&P Note (Signed)
History and Physical Interval Note:  12/15/2015 9:47 AM  Antonio Hughes  has presented today for surgery, with the diagnosis of HISTORY OF COLON POLYPS  The various methods of treatment have been discussed with the patient and family. After consideration of risks, benefits and other options for treatment, the patient has consented to  Procedure(s) with comments: COLONOSCOPY (N/A) - 930 as a surgical intervention .  The patient's history has been reviewed, patient examined, no change in status, stable for surgery.  I have reviewed the patient's chart and labs.  Questions were answered to the patient's satisfaction.     Antonio Hughes  No change. Surveillance colonoscopy per plan.  The risks, benefits, limitations, alternatives and imponderables have been reviewed with the patient. Questions have been answered. All parties are agreeable.

## 2015-12-15 NOTE — Discharge Instructions (Addendum)

## 2015-12-21 ENCOUNTER — Encounter (HOSPITAL_COMMUNITY): Payer: Self-pay | Admitting: Internal Medicine

## 2016-04-18 DIAGNOSIS — C61 Malignant neoplasm of prostate: Secondary | ICD-10-CM | POA: Diagnosis not present

## 2016-04-18 DIAGNOSIS — I10 Essential (primary) hypertension: Secondary | ICD-10-CM | POA: Diagnosis not present

## 2016-04-18 DIAGNOSIS — Z8673 Personal history of transient ischemic attack (TIA), and cerebral infarction without residual deficits: Secondary | ICD-10-CM | POA: Diagnosis not present

## 2016-04-18 DIAGNOSIS — Z23 Encounter for immunization: Secondary | ICD-10-CM | POA: Diagnosis not present

## 2016-06-11 DIAGNOSIS — N5201 Erectile dysfunction due to arterial insufficiency: Secondary | ICD-10-CM | POA: Diagnosis not present

## 2016-06-11 DIAGNOSIS — Z8546 Personal history of malignant neoplasm of prostate: Secondary | ICD-10-CM | POA: Diagnosis not present

## 2016-08-17 DIAGNOSIS — I10 Essential (primary) hypertension: Secondary | ICD-10-CM | POA: Diagnosis not present

## 2016-08-17 DIAGNOSIS — C61 Malignant neoplasm of prostate: Secondary | ICD-10-CM | POA: Diagnosis not present

## 2016-08-17 DIAGNOSIS — Z8673 Personal history of transient ischemic attack (TIA), and cerebral infarction without residual deficits: Secondary | ICD-10-CM | POA: Diagnosis not present

## 2016-08-17 DIAGNOSIS — R739 Hyperglycemia, unspecified: Secondary | ICD-10-CM | POA: Diagnosis not present

## 2016-10-17 DIAGNOSIS — I10 Essential (primary) hypertension: Secondary | ICD-10-CM | POA: Diagnosis not present

## 2016-10-17 DIAGNOSIS — Z8673 Personal history of transient ischemic attack (TIA), and cerebral infarction without residual deficits: Secondary | ICD-10-CM | POA: Diagnosis not present

## 2016-10-17 DIAGNOSIS — C61 Malignant neoplasm of prostate: Secondary | ICD-10-CM | POA: Diagnosis not present

## 2016-11-08 DIAGNOSIS — H25813 Combined forms of age-related cataract, bilateral: Secondary | ICD-10-CM | POA: Diagnosis not present

## 2017-03-20 DIAGNOSIS — Z23 Encounter for immunization: Secondary | ICD-10-CM | POA: Diagnosis not present

## 2017-04-17 DIAGNOSIS — Z8673 Personal history of transient ischemic attack (TIA), and cerebral infarction without residual deficits: Secondary | ICD-10-CM | POA: Diagnosis not present

## 2017-04-17 DIAGNOSIS — Z800 Family history of malignant neoplasm of digestive organs: Secondary | ICD-10-CM | POA: Diagnosis not present

## 2017-04-17 DIAGNOSIS — Z8546 Personal history of malignant neoplasm of prostate: Secondary | ICD-10-CM | POA: Diagnosis not present

## 2017-04-17 DIAGNOSIS — I10 Essential (primary) hypertension: Secondary | ICD-10-CM | POA: Diagnosis not present

## 2017-06-19 DIAGNOSIS — Z8546 Personal history of malignant neoplasm of prostate: Secondary | ICD-10-CM | POA: Diagnosis not present

## 2017-10-16 DIAGNOSIS — Z8673 Personal history of transient ischemic attack (TIA), and cerebral infarction without residual deficits: Secondary | ICD-10-CM | POA: Diagnosis not present

## 2017-10-16 DIAGNOSIS — E785 Hyperlipidemia, unspecified: Secondary | ICD-10-CM | POA: Diagnosis not present

## 2017-10-16 DIAGNOSIS — Z8546 Personal history of malignant neoplasm of prostate: Secondary | ICD-10-CM | POA: Diagnosis not present

## 2017-10-16 DIAGNOSIS — I10 Essential (primary) hypertension: Secondary | ICD-10-CM | POA: Diagnosis not present

## 2017-10-17 DIAGNOSIS — I10 Essential (primary) hypertension: Secondary | ICD-10-CM | POA: Diagnosis not present

## 2017-10-17 DIAGNOSIS — Z8673 Personal history of transient ischemic attack (TIA), and cerebral infarction without residual deficits: Secondary | ICD-10-CM | POA: Diagnosis not present

## 2017-10-17 DIAGNOSIS — Z8546 Personal history of malignant neoplasm of prostate: Secondary | ICD-10-CM | POA: Diagnosis not present

## 2017-10-17 DIAGNOSIS — E785 Hyperlipidemia, unspecified: Secondary | ICD-10-CM | POA: Diagnosis not present

## 2018-04-15 DIAGNOSIS — Z8673 Personal history of transient ischemic attack (TIA), and cerebral infarction without residual deficits: Secondary | ICD-10-CM | POA: Diagnosis not present

## 2018-04-15 DIAGNOSIS — I10 Essential (primary) hypertension: Secondary | ICD-10-CM | POA: Diagnosis not present

## 2018-04-15 DIAGNOSIS — Z8546 Personal history of malignant neoplasm of prostate: Secondary | ICD-10-CM | POA: Diagnosis not present

## 2018-04-15 DIAGNOSIS — K5730 Diverticulosis of large intestine without perforation or abscess without bleeding: Secondary | ICD-10-CM | POA: Diagnosis not present

## 2018-04-17 DIAGNOSIS — Z8673 Personal history of transient ischemic attack (TIA), and cerebral infarction without residual deficits: Secondary | ICD-10-CM | POA: Diagnosis not present

## 2018-04-17 DIAGNOSIS — K5730 Diverticulosis of large intestine without perforation or abscess without bleeding: Secondary | ICD-10-CM | POA: Diagnosis not present

## 2018-04-17 DIAGNOSIS — I10 Essential (primary) hypertension: Secondary | ICD-10-CM | POA: Diagnosis not present

## 2018-04-17 DIAGNOSIS — Z8546 Personal history of malignant neoplasm of prostate: Secondary | ICD-10-CM | POA: Diagnosis not present

## 2018-05-28 DIAGNOSIS — Z008 Encounter for other general examination: Secondary | ICD-10-CM | POA: Diagnosis not present

## 2018-08-12 DIAGNOSIS — Z9841 Cataract extraction status, right eye: Secondary | ICD-10-CM | POA: Diagnosis not present

## 2018-08-12 DIAGNOSIS — Z8546 Personal history of malignant neoplasm of prostate: Secondary | ICD-10-CM | POA: Diagnosis not present

## 2018-08-12 DIAGNOSIS — Z923 Personal history of irradiation: Secondary | ICD-10-CM | POA: Diagnosis not present

## 2018-08-12 DIAGNOSIS — F419 Anxiety disorder, unspecified: Secondary | ICD-10-CM | POA: Diagnosis not present

## 2018-08-12 DIAGNOSIS — Z20828 Contact with and (suspected) exposure to other viral communicable diseases: Secondary | ICD-10-CM | POA: Diagnosis not present

## 2018-08-12 DIAGNOSIS — H918X3 Other specified hearing loss, bilateral: Secondary | ICD-10-CM | POA: Diagnosis not present

## 2018-08-12 DIAGNOSIS — Z974 Presence of external hearing-aid: Secondary | ICD-10-CM | POA: Diagnosis not present

## 2018-08-12 DIAGNOSIS — M5001 Cervical disc disorder with myelopathy,  high cervical region: Secondary | ICD-10-CM | POA: Diagnosis not present

## 2018-08-12 DIAGNOSIS — E785 Hyperlipidemia, unspecified: Secondary | ICD-10-CM | POA: Diagnosis not present

## 2018-08-12 DIAGNOSIS — M1990 Unspecified osteoarthritis, unspecified site: Secondary | ICD-10-CM | POA: Diagnosis not present

## 2018-08-12 DIAGNOSIS — F329 Major depressive disorder, single episode, unspecified: Secondary | ICD-10-CM | POA: Diagnosis not present

## 2018-08-12 DIAGNOSIS — C652 Malignant neoplasm of left renal pelvis: Secondary | ICD-10-CM | POA: Diagnosis not present

## 2018-08-12 DIAGNOSIS — N5201 Erectile dysfunction due to arterial insufficiency: Secondary | ICD-10-CM | POA: Diagnosis not present

## 2018-08-12 DIAGNOSIS — C9000 Multiple myeloma not having achieved remission: Secondary | ICD-10-CM | POA: Diagnosis not present

## 2018-08-12 DIAGNOSIS — Z9842 Cataract extraction status, left eye: Secondary | ICD-10-CM | POA: Diagnosis not present

## 2018-10-21 DIAGNOSIS — Z7189 Other specified counseling: Secondary | ICD-10-CM | POA: Diagnosis not present

## 2018-10-21 DIAGNOSIS — I10 Essential (primary) hypertension: Secondary | ICD-10-CM | POA: Diagnosis not present

## 2018-10-21 DIAGNOSIS — Z8546 Personal history of malignant neoplasm of prostate: Secondary | ICD-10-CM | POA: Diagnosis not present

## 2018-10-21 DIAGNOSIS — Z8673 Personal history of transient ischemic attack (TIA), and cerebral infarction without residual deficits: Secondary | ICD-10-CM | POA: Diagnosis not present

## 2018-11-12 DIAGNOSIS — H25813 Combined forms of age-related cataract, bilateral: Secondary | ICD-10-CM | POA: Diagnosis not present

## 2019-06-22 ENCOUNTER — Ambulatory Visit: Payer: PPO

## 2019-06-24 ENCOUNTER — Other Ambulatory Visit: Payer: Self-pay

## 2019-06-24 ENCOUNTER — Ambulatory Visit: Payer: PPO | Attending: Internal Medicine

## 2019-06-24 DIAGNOSIS — Z23 Encounter for immunization: Secondary | ICD-10-CM | POA: Insufficient documentation

## 2019-06-24 NOTE — Progress Notes (Signed)
   Covid-19 Vaccination Clinic  Name:  Antonio Hughes    MRN: [id number] DOB: 26-Jan-1946  06/24/2019  [name] was observed post Covid-19 immunization for 15 minutes without incidence. He was provided with Vaccine Information Sheet and instruction to access the V-Safe system.   [name] was instructed to call 911 with any severe reactions post vaccine: Marland Kitchen Difficulty breathing  . Swelling of your face and throat  . A fast heartbeat  . A bad rash all over your body  . Dizziness and weakness    Immunizations Administered    Name Date Dose VIS Date Route   Moderna COVID-19 Vaccine 06/24/2019 11:44 AM 0.5 mL 04/07/2019 Intramuscular   Manufacturer: Moderna   Lot: [id number]   [id number]

## 2019-07-22 ENCOUNTER — Ambulatory Visit: Payer: PPO | Attending: Internal Medicine

## 2019-07-22 DIAGNOSIS — Z23 Encounter for immunization: Secondary | ICD-10-CM

## 2019-07-22 NOTE — Progress Notes (Signed)
   Covid-19 Vaccination Clinic  Name:  AHARON WENTWORTH    MRN: [id number] DOB: 1945/12/01  07/22/2019  Mr. Rabelo was observed post Covid-19 immunization for 15 minutes without incident. He was provided with Vaccine Information Sheet and instruction to access the V-Safe system.   Mr. Carrera was instructed to call 911 with any severe reactions post vaccine: Marland Kitchen Difficulty breathing  . Swelling of face and throat  . A fast heartbeat  . A bad rash all over body  . Dizziness and weakness   Immunizations Administered    Name Date Dose VIS Date Route   Moderna COVID-19 Vaccine 07/22/2019 11:13 AM 0.5 mL 04/07/2019 Intramuscular   Manufacturer: Moderna   Lot: [id number]   JoaquinDW:[id number]

## 2019-08-19 DIAGNOSIS — E785 Hyperlipidemia, unspecified: Secondary | ICD-10-CM | POA: Diagnosis not present

## 2019-08-19 DIAGNOSIS — K5730 Diverticulosis of large intestine without perforation or abscess without bleeding: Secondary | ICD-10-CM | POA: Diagnosis not present

## 2019-08-19 DIAGNOSIS — Z8673 Personal history of transient ischemic attack (TIA), and cerebral infarction without residual deficits: Secondary | ICD-10-CM | POA: Diagnosis not present

## 2019-08-19 DIAGNOSIS — I10 Essential (primary) hypertension: Secondary | ICD-10-CM | POA: Diagnosis not present

## 2019-08-19 DIAGNOSIS — Z8546 Personal history of malignant neoplasm of prostate: Secondary | ICD-10-CM | POA: Diagnosis not present

## 2019-09-14 DIAGNOSIS — E875 Hyperkalemia: Secondary | ICD-10-CM | POA: Diagnosis not present

## 2019-09-14 DIAGNOSIS — Z7189 Other specified counseling: Secondary | ICD-10-CM | POA: Diagnosis not present

## 2019-09-14 DIAGNOSIS — I10 Essential (primary) hypertension: Secondary | ICD-10-CM | POA: Diagnosis not present

## 2019-09-14 DIAGNOSIS — Z8546 Personal history of malignant neoplasm of prostate: Secondary | ICD-10-CM | POA: Diagnosis not present

## 2020-01-14 DIAGNOSIS — Z79891 Long term (current) use of opiate analgesic: Secondary | ICD-10-CM | POA: Diagnosis not present

## 2020-01-14 DIAGNOSIS — R799 Abnormal finding of blood chemistry, unspecified: Secondary | ICD-10-CM | POA: Diagnosis not present

## 2020-01-14 DIAGNOSIS — Z23 Encounter for immunization: Secondary | ICD-10-CM | POA: Diagnosis not present

## 2020-01-14 DIAGNOSIS — I10 Essential (primary) hypertension: Secondary | ICD-10-CM | POA: Diagnosis not present

## 2020-01-14 DIAGNOSIS — Z8546 Personal history of malignant neoplasm of prostate: Secondary | ICD-10-CM | POA: Diagnosis not present

## 2020-01-14 DIAGNOSIS — E875 Hyperkalemia: Secondary | ICD-10-CM | POA: Diagnosis not present

## 2020-01-14 DIAGNOSIS — Z87891 Personal history of nicotine dependence: Secondary | ICD-10-CM | POA: Diagnosis not present

## 2020-03-16 DIAGNOSIS — Z8546 Personal history of malignant neoplasm of prostate: Secondary | ICD-10-CM | POA: Diagnosis not present

## 2020-03-16 DIAGNOSIS — N5201 Erectile dysfunction due to arterial insufficiency: Secondary | ICD-10-CM | POA: Diagnosis not present

## 2020-05-16 DIAGNOSIS — Z8673 Personal history of transient ischemic attack (TIA), and cerebral infarction without residual deficits: Secondary | ICD-10-CM | POA: Diagnosis not present

## 2020-05-16 DIAGNOSIS — Z7189 Other specified counseling: Secondary | ICD-10-CM | POA: Diagnosis not present

## 2020-05-16 DIAGNOSIS — I10 Essential (primary) hypertension: Secondary | ICD-10-CM | POA: Diagnosis not present

## 2020-05-16 DIAGNOSIS — Z8546 Personal history of malignant neoplasm of prostate: Secondary | ICD-10-CM | POA: Diagnosis not present

## 2020-06-27 DIAGNOSIS — H25013 Cortical age-related cataract, bilateral: Secondary | ICD-10-CM | POA: Diagnosis not present

## 2020-07-28 DIAGNOSIS — H25011 Cortical age-related cataract, right eye: Secondary | ICD-10-CM | POA: Diagnosis not present

## 2020-07-28 DIAGNOSIS — H268 Other specified cataract: Secondary | ICD-10-CM | POA: Diagnosis not present

## 2020-08-21 DIAGNOSIS — H2512 Age-related nuclear cataract, left eye: Secondary | ICD-10-CM | POA: Diagnosis not present

## 2020-08-25 DIAGNOSIS — H25012 Cortical age-related cataract, left eye: Secondary | ICD-10-CM | POA: Diagnosis not present

## 2020-08-25 DIAGNOSIS — H268 Other specified cataract: Secondary | ICD-10-CM | POA: Diagnosis not present

## 2020-09-13 DIAGNOSIS — Z8546 Personal history of malignant neoplasm of prostate: Secondary | ICD-10-CM | POA: Diagnosis not present

## 2020-09-13 DIAGNOSIS — I10 Essential (primary) hypertension: Secondary | ICD-10-CM | POA: Diagnosis not present

## 2020-12-19 DIAGNOSIS — E039 Hypothyroidism, unspecified: Secondary | ICD-10-CM | POA: Diagnosis not present

## 2020-12-19 DIAGNOSIS — E7800 Pure hypercholesterolemia, unspecified: Secondary | ICD-10-CM | POA: Diagnosis not present

## 2020-12-19 DIAGNOSIS — I10 Essential (primary) hypertension: Secondary | ICD-10-CM | POA: Diagnosis not present

## 2021-01-18 DIAGNOSIS — L723 Sebaceous cyst: Secondary | ICD-10-CM | POA: Diagnosis not present

## 2021-01-18 DIAGNOSIS — I10 Essential (primary) hypertension: Secondary | ICD-10-CM | POA: Diagnosis not present

## 2021-03-15 DIAGNOSIS — R8271 Bacteriuria: Secondary | ICD-10-CM | POA: Diagnosis not present

## 2021-03-15 DIAGNOSIS — N5201 Erectile dysfunction due to arterial insufficiency: Secondary | ICD-10-CM | POA: Diagnosis not present

## 2021-03-15 DIAGNOSIS — Z8546 Personal history of malignant neoplasm of prostate: Secondary | ICD-10-CM | POA: Diagnosis not present

## 2021-09-13 ENCOUNTER — Emergency Department (HOSPITAL_COMMUNITY)
Admission: EM | Admit: 2021-09-13 | Discharge: 2021-09-13 | Disposition: A | Discharge status: Home / Self Care | Payer: PPO | Attending: Emergency Medicine | Admitting: Emergency Medicine

## 2021-09-13 ENCOUNTER — Other Ambulatory Visit: Payer: Self-pay

## 2021-09-13 ENCOUNTER — Encounter (HOSPITAL_COMMUNITY): Payer: Self-pay | Admitting: *Deleted

## 2021-09-13 DIAGNOSIS — R42 Dizziness and giddiness: Secondary | ICD-10-CM | POA: Insufficient documentation

## 2021-09-13 DIAGNOSIS — H538 Other visual disturbances: Secondary | ICD-10-CM | POA: Insufficient documentation

## 2021-09-13 DIAGNOSIS — Z7982 Long term (current) use of aspirin: Secondary | ICD-10-CM | POA: Diagnosis not present

## 2021-09-13 DIAGNOSIS — R55 Syncope and collapse: Secondary | ICD-10-CM | POA: Insufficient documentation

## 2021-09-13 LAB — CBC WITH DIFFERENTIAL/PLATELET
Abs Immature Granulocytes: 0.04 10*3/uL (ref 0.00–0.07)
Basophils Absolute: 0 10*3/uL (ref 0.0–0.1)
Basophils Relative: 0 %
Eosinophils Absolute: 0.1 10*3/uL (ref 0.0–0.5)
Eosinophils Relative: 1 %
HCT: 41.3 % (ref 39.0–52.0)
Hemoglobin: 14.3 g/dL (ref 13.0–17.0)
Immature Granulocytes: 0 %
Lymphocytes Relative: 9 %
Lymphs Abs: 1 10*3/uL (ref 0.7–4.0)
MCH: 27.2 pg (ref 26.0–34.0)
MCHC: 34.6 g/dL (ref 30.0–36.0)
MCV: 78.5 fL — ABNORMAL LOW (ref 80.0–100.0)
Monocytes Absolute: 0.5 10*3/uL (ref 0.1–1.0)
Monocytes Relative: 4 %
Neutro Abs: 9.5 10*3/uL — ABNORMAL HIGH (ref 1.7–7.7)
Neutrophils Relative %: 86 %
Platelets: 235 10*3/uL (ref 150–400)
RBC: 5.26 MIL/uL (ref 4.22–5.81)
RDW: 14.1 % (ref 11.5–15.5)
WBC: 11.1 10*3/uL — ABNORMAL HIGH (ref 4.0–10.5)
nRBC: 0 % (ref 0.0–0.2)

## 2021-09-13 LAB — URINALYSIS, ROUTINE W REFLEX MICROSCOPIC
Bilirubin Urine: NEGATIVE
Glucose, UA: NEGATIVE mg/dL
Hgb urine dipstick: NEGATIVE
Ketones, ur: NEGATIVE mg/dL
Leukocytes,Ua: NEGATIVE
Nitrite: NEGATIVE
Protein, ur: NEGATIVE mg/dL
Specific Gravity, Urine: 1.016 (ref 1.005–1.030)
pH: 7 (ref 5.0–8.0)

## 2021-09-13 LAB — CBG MONITORING, ED: Glucose-Capillary: 115 mg/dL — ABNORMAL HIGH (ref 70–99)

## 2021-09-13 LAB — COMPREHENSIVE METABOLIC PANEL
ALT: 23 U/L (ref 0–44)
AST: 21 U/L (ref 15–41)
Albumin: 4.1 g/dL (ref 3.5–5.0)
Alkaline Phosphatase: 61 U/L (ref 38–126)
Anion gap: 10 (ref 5–15)
BUN: 21 mg/dL (ref 8–23)
CO2: 27 mmol/L (ref 22–32)
Calcium: 9.7 mg/dL (ref 8.9–10.3)
Chloride: 101 mmol/L (ref 98–111)
Creatinine, Ser: 1.11 mg/dL (ref 0.61–1.24)
GFR, Estimated: >60 mL/min (ref >60)
Glucose, Bld: 129 mg/dL — ABNORMAL HIGH (ref 70–99)
Potassium: 4.3 mmol/L (ref 3.5–5.1)
Sodium: 138 mmol/L (ref 135–145)
Total Bilirubin: 0.8 mg/dL (ref 0.3–1.2)
Total Protein: 7.8 g/dL (ref 6.5–8.1)

## 2021-09-13 LAB — TROPONIN I (HIGH SENSITIVITY)
Troponin I (High Sensitivity): 5 ng/L (ref <18)
Troponin I (High Sensitivity): 5 ng/L (ref <18)

## 2021-09-13 NOTE — Discharge Instructions (Addendum)
Call your primary care doctor or specialist as discussed in the next 2-3 days.   Return immediately back to the ER if:  Your symptoms worsen within the next 12-24 hours. You develop new symptoms such as new fevers, persistent vomiting, new pain, shortness of breath, or new weakness or numbness, or if you have any other concerns.  

## 2021-09-13 NOTE — ED Provider Notes (Addendum)
?[place] ?Provider Note ? ? ?CSN: [id number] ?Arrival date & time: 09/13/21  1551 ? ?  ? ?History ? ?Chief Complaint  ?Patient presents with  ? Near Syncope  ? ? ?Antonio Hughes is a 76 y.o. male. ? ?Patient presents to ER chief complaint of episode of lightheadedness dizziness.  He said he was sitting watching TV when he felt lightheaded and dizzy and had blurred vision.  Symptoms lasted from 30 minutes to an hour and gradually improved.  Denies any headache or chest pain.  Denies any abdominal pain.  He is not quite sure what happened.  Denies passing out.  He states everything has resolved and he feels fine.   ? ? ?  ? ?Home Medications ?Prior to Admission medications   ?Medication Sig Start Date End Date Taking? Authorizing Provider  ?aspirin 81 MG tablet Take 81 mg by mouth daily.   Yes [provider]  ?chlorthalidone (HYGROTON) 25 MG tablet Take 12.5 mg by mouth every other day.   Yes [provider]  ?diltiazem (CARDIZEM CD) 180 MG 24 hr capsule Take 360 mg by mouth daily.   Yes [provider]  ?docusate sodium 100 MG CAPS Take 100 mg by mouth daily. 05/15/13  Yes Felicie Morn, MD  ?potassium chloride (KLOR-CON) 10 MEQ tablet Take 10 mEq by mouth daily. 09/05/21  Yes [provider]  ?polyethylene glycol-electrolytes (TRILYTE) 420 g solution Take 4,000 mLs by mouth as directed. ?Patient not taking: Reported on 09/13/2021 11/17/15   Daneil Dolin, MD  ?   ? ?Allergies    ?Patient has no known allergies.   ? ?Review of Systems   ?Review of Systems  ?Constitutional:  Negative for fever.  ?HENT:  Negative for ear pain and sore throat.   ?Eyes:  Negative for pain.  ?Respiratory:  Negative for cough.   ?Cardiovascular:  Negative for chest pain.  ?Gastrointestinal:  Negative for abdominal pain.  ?Genitourinary:  Negative for flank pain.  ?Musculoskeletal:  Negative for back pain.  ?Skin:  Negative for color change and rash.  ?Neurological:  Negative  for syncope.  ?All other systems reviewed and are negative. ? ?Physical Exam ?Updated Vital Signs ?BP (!) 152/101   Pulse 71   Temp 97.7 ?F (36.5 ?C) (Oral)   Resp 17   Ht '5\' 6"'$  (1.676 m)   Wt 65.8 kg   SpO2 97%   BMI 23.40 kg/m?  ?Physical Exam ?Constitutional:   ?   Appearance: He is well-developed.  ?HENT:  ?   Head: Normocephalic.  ?   Nose: Nose normal.  ?Eyes:  ?   Extraocular Movements: Extraocular movements intact.  ?Cardiovascular:  ?   Rate and Rhythm: Normal rate.  ?Pulmonary:  ?   Effort: Pulmonary effort is normal.  ?Skin: ?   Coloration: Skin is not jaundiced.  ?Neurological:  ?   General: No focal deficit present.  ?   Mental Status: He is alert and oriented to person, place, and time. Mental status is at baseline.  ?   Cranial Nerves: No cranial nerve deficit.  ?   Motor: No weakness.  ? ? ?ED Results / Procedures / Treatments   ?Labs ?(all labs ordered are listed, but only abnormal results are displayed) ?Labs Reviewed  ?URINALYSIS, ROUTINE W REFLEX MICROSCOPIC - Abnormal; Notable for the following components:  ?    Result Value  ? APPearance HAZY (*)   ? All other components within normal limits  ?CBC  WITH DIFFERENTIAL/PLATELET - Abnormal; Notable for the following components:  ? WBC 11.1 (*)   ? MCV 78.5 (*)   ? Neutro Abs 9.5 (*)   ? All other components within normal limits  ?COMPREHENSIVE METABOLIC PANEL - Abnormal; Notable for the following components:  ? Glucose, Bld 129 (*)   ? All other components within normal limits  ?CBG MONITORING, ED - Abnormal; Notable for the following components:  ? Glucose-Capillary 115 (*)   ? All other components within normal limits  ?TROPONIN I (HIGH SENSITIVITY)  ?TROPONIN I (HIGH SENSITIVITY)  ? ? ?EKG ?EKG Interpretation ? ?Date/Time:  Wednesday Sep 13 2021 16:13:34 EDT ?Ventricular Rate:  65 ?PR Interval:  152 ?QRS Duration: 89 ?QT Interval:  399 ?QTC Calculation: 415 ?R Axis:   -1 ?Text Interpretation: Sinus rhythm Probable left atrial enlargement  Probable left ventricular hypertrophy ST elevation suggests acute pericarditis Tall T, consider metabolic/ischemic abnrm Confirmed by Thamas Jaegers ([id number]) on 09/13/2021 4:20:52 PM ? ?Radiology ?No results found. ? ?Procedures ?Procedures  ? ? ?Medications Ordered in ED ?Medications - No data to display ? ?ED Course/ Medical Decision Making/ A&P ?  ?                        ?Medical Decision Making ?Amount and/or Complexity of Data Reviewed ?Labs: ordered. ? ? ?Cardiac monitor shows sinus rhythm. ? ?History obtained from family at bedside. ? ?Chart review shows outpatient office visit March 15, 2021 for erectile dysfunction. ? ?Diagnostic studies includes labs CBC CMP which were unremarkable.  Vital signs are within normal limits.  Urinalysis unremarkable. ? ?On repeat evaluation at 8 PM, patient is awake and alert smiling has no chest pain or discomfort. ? ?Patient observed in the cardiac monitor with no adverse events, etiology of his symptoms unclear, discharged home in stable condition, advised outpatient follow-up with his doctor this week.  Advising immediate return for worsening symptoms or any additional concerns. ? ? ? ? ? ? ? ?Final Clinical Impression(s) / ED Diagnoses ?Final diagnoses:  ?Dizziness  ? ? ?Rx / DC Orders ?ED Discharge Orders   ? ? None  ? ?  ? ? ?  ?Luna Fuse, MD ?09/13/21 2025 ? ?  ?Luna Fuse, MD ?09/13/21 2025 ? ?

## 2021-09-13 NOTE — ED Triage Notes (Signed)
BIB [employer], from [facility] s/p episode of feeling generally weak and fatigued w/ dizziness, blurred vision, and NV x1. Zofran given PTA. NSL 20 g L AC. CBG 131. Denies CP, sob, syncope, fever, diarrhea or recent illness. Denies cardiac hx. VSS. Sx onset 1300. EMS called after 1500. ?

## 2022-10-27 ENCOUNTER — Emergency Department (HOSPITAL_COMMUNITY): Payer: PPO

## 2022-10-27 ENCOUNTER — Encounter (HOSPITAL_COMMUNITY): Payer: Self-pay | Admitting: *Deleted

## 2022-10-27 ENCOUNTER — Emergency Department (HOSPITAL_COMMUNITY)
Admission: EM | Admit: 2022-10-27 | Discharge: 2022-10-28 | Disposition: A | Discharge status: Home / Self Care | Payer: PPO | Attending: Emergency Medicine | Admitting: Emergency Medicine

## 2022-10-27 ENCOUNTER — Other Ambulatory Visit: Payer: Self-pay

## 2022-10-27 DIAGNOSIS — R079 Chest pain, unspecified: Secondary | ICD-10-CM | POA: Diagnosis not present

## 2022-10-27 DIAGNOSIS — R1013 Epigastric pain: Secondary | ICD-10-CM | POA: Diagnosis present

## 2022-10-27 DIAGNOSIS — Z87891 Personal history of nicotine dependence: Secondary | ICD-10-CM | POA: Diagnosis not present

## 2022-10-27 DIAGNOSIS — I10 Essential (primary) hypertension: Secondary | ICD-10-CM | POA: Diagnosis not present

## 2022-10-27 DIAGNOSIS — Z8546 Personal history of malignant neoplasm of prostate: Secondary | ICD-10-CM | POA: Diagnosis not present

## 2022-10-27 DIAGNOSIS — Z7982 Long term (current) use of aspirin: Secondary | ICD-10-CM | POA: Insufficient documentation

## 2022-10-27 LAB — CBC WITH DIFFERENTIAL/PLATELET
Abs Immature Granulocytes: 0.02 10*3/uL (ref 0.00–0.07)
Basophils Absolute: 0 10*3/uL (ref 0.0–0.1)
Basophils Relative: 0 %
Eosinophils Absolute: 0.2 10*3/uL (ref 0.0–0.5)
Eosinophils Relative: 2 %
HCT: 36.6 % — ABNORMAL LOW (ref 39.0–52.0)
Hemoglobin: 12.9 g/dL — ABNORMAL LOW (ref 13.0–17.0)
Immature Granulocytes: 0 %
Lymphocytes Relative: 14 %
Lymphs Abs: 1.5 10*3/uL (ref 0.7–4.0)
MCH: 27.8 pg (ref 26.0–34.0)
MCHC: 35.2 g/dL (ref 30.0–36.0)
MCV: 78.9 fL — ABNORMAL LOW (ref 80.0–100.0)
Monocytes Absolute: 0.7 10*3/uL (ref 0.1–1.0)
Monocytes Relative: 7 %
Neutro Abs: 7.9 10*3/uL — ABNORMAL HIGH (ref 1.7–7.7)
Neutrophils Relative %: 77 %
Platelets: 191 10*3/uL (ref 150–400)
RBC: 4.64 MIL/uL (ref 4.22–5.81)
RDW: 14 % (ref 11.5–15.5)
WBC: 10.3 10*3/uL (ref 4.0–10.5)
nRBC: 0 % (ref 0.0–0.2)

## 2022-10-27 LAB — COMPREHENSIVE METABOLIC PANEL
ALT: 32 U/L (ref 0–44)
AST: 43 U/L — ABNORMAL HIGH (ref 15–41)
Albumin: 3.4 g/dL — ABNORMAL LOW (ref 3.5–5.0)
Alkaline Phosphatase: 61 U/L (ref 38–126)
Anion gap: 12 (ref 5–15)
BUN: 18 mg/dL (ref 8–23)
CO2: 24 mmol/L (ref 22–32)
Calcium: 9.4 mg/dL (ref 8.9–10.3)
Chloride: 101 mmol/L (ref 98–111)
Creatinine, Ser: 1.16 mg/dL (ref 0.61–1.24)
GFR, Estimated: >60 mL/min (ref >60)
Glucose, Bld: 117 mg/dL — ABNORMAL HIGH (ref 70–99)
Potassium: 4 mmol/L (ref 3.5–5.1)
Sodium: 137 mmol/L (ref 135–145)
Total Bilirubin: 0.4 mg/dL (ref 0.3–1.2)
Total Protein: 7.1 g/dL (ref 6.5–8.1)

## 2022-10-27 LAB — TROPONIN I (HIGH SENSITIVITY): Troponin I (High Sensitivity): 7 ng/L (ref <18)

## 2022-10-27 LAB — LIPASE, BLOOD: Lipase: 41 U/L (ref 11–51)

## 2022-10-27 NOTE — ED Triage Notes (Signed)
The pt had a sweating episode earlier today while driving.  He has not been drinking any liquids today    ems that brought him gave him 500mg  of nss enroute . Initially had an abd cramp  after the iv fluid the pain has gone away  no pain or discomfort at present time. A and o x 4 no distress at present.  Iv per ems

## 2022-10-27 NOTE — ED Provider Notes (Signed)
11:28 PM Assumed care from Dr. Suezanne Jacquet, please see their note for full history, physical and decision making until this point. In brief this is a 77 y.o. year old male who presented to the ED tonight with Abdominal Pain     Upper abdominal pain with nausea and diaphoresis, spontaneously improved. Ecg baseline. Pending second troponin for disoposition.   Second troponin normal.  Patient still asymptomatic and has been asymptomatic ever since being here.  Has a slightly elevated blood pressure of 141 and slightly low heart rate 40s to 50s but is asymptomatic with these.  Most of his blood pressures have been in the 130s.  He is compliant his medications.  He will follow-up with his PCP for further management of his blood pressure.  Will also discuss his visit here and I put in a referral for cardiology for possible stress testing depending on their recommendations.  Otherwise no acute needs at this time.  Patient discharged asymptomatic.  Discharge instructions, including strict return precautions for new or worsening symptoms, given. Patient and/or family verbalized understanding and agreement with the plan as described.   Labs, studies and imaging reviewed by myself and considered in medical decision making if ordered. Imaging interpreted by radiology.  Labs Reviewed  COMPREHENSIVE METABOLIC PANEL - Abnormal; Notable for the following components:      Result Value   Glucose, Bld 117 (*)    Albumin 3.4 (*)    AST 43 (*)    All other components within normal limits  CBC WITH DIFFERENTIAL/PLATELET - Abnormal; Notable for the following components:   Hemoglobin 12.9 (*)    HCT 36.6 (*)    MCV 78.9 (*)    Neutro Abs 7.9 (*)    All other components within normal limits  LIPASE, BLOOD  TROPONIN I (HIGH SENSITIVITY)  TROPONIN I (HIGH SENSITIVITY)    DG Chest Port 1 View  Final Result      No follow-ups on file.    Antonio Hughes, Antonio Cower, MD 10/28/22 [phone]

## 2022-10-27 NOTE — ED Provider Notes (Signed)
[place] EMERGENCY DEPARTMENT AT [facility] Provider Note  CSN: [id number] Arrival date & time: 10/27/22 2010  Chief Complaint(s) Abdominal Pain  HPI Antonio Hughes is a 77 y.o. male with history of hypertension presenting to the emergency department with epigastric pain.  Patient reports that he was driving , had upper abdominal pain, felt sweaty and nauseous.  Had to pull over.  He called the paramedics, was feeling better and did not want to come to the hospital but they advised that she come to get checked out.  Denies any symptoms currently.  No shortness of breath.  Pain did not radiate.  No fevers or chills, cough.  No vomiting.  Denies similar episode.   Past Medical History Past Medical History:  Diagnosis Date   H/O inguinal hernia    s/p repair   H/O prostate cancer    s/p surgical intervention   Hypertension    Patient Active Problem List   Diagnosis Date Noted   History of colonic polyps 11/17/2015   Inguinal hernia 05/14/2013   Home Medication(s) Prior to Admission medications   Medication Sig Start Date End Date Taking? Authorizing Provider  aspirin 81 MG tablet Take 81 mg by mouth daily.    [provider]  chlorthalidone (HYGROTON) 25 MG tablet Take 12.5 mg by mouth every other day.    [provider]  diltiazem (CARDIZEM CD) 180 MG 24 hr capsule Take 360 mg by mouth daily.    [provider]  docusate sodium 100 MG CAPS Take 100 mg by mouth daily. 05/15/13   Barbaraann Barthel, MD  polyethylene glycol-electrolytes (TRILYTE) 420 g solution Take 4,000 mLs by mouth as directed. Patient not taking: Reported on 09/13/2021 11/17/15   Corbin Ade, MD  potassium chloride (KLOR-CON) 10 MEQ tablet Take 10 mEq by mouth daily. 09/05/21   [provider]                                                                                                                                    Past Surgical History Past Surgical  History:  Procedure Laterality Date   COLONOSCOPY N/A 12/15/2015   Procedure: COLONOSCOPY;  Surgeon: Corbin Ade, MD;  Location: [facility]ITE;  Service: Endoscopy;  Laterality: N/A;  930   INGUINAL HERNIA REPAIR Right 05/14/2013   Procedure: HERNIA REPAIR INGUINAL ADULT;  Surgeon: Marlane Hatcher, MD;  Location: [facility] ORS;  Service: General;  Laterality: Right;   PROSTATE SURGERY  2011   Family History Family History  Problem Relation Age of Onset   Esophageal cancer Father    Colon cancer Neg Hx     Social History Social History   Tobacco Use   Smoking status: Former    Packs/day: 5.00    Years: 10.00    Additional pack years: 0.00    Total pack years: 50.00    Types: Cigarettes  Quit date: 05/08/1971    Years since quitting: 51.5   Smokeless tobacco: Never  Substance Use Topics   Alcohol use: No    Alcohol/week: 0.0 standard drinks of alcohol   Drug use: No   Allergies Patient has no known allergies.  Review of Systems Review of Systems  All other systems reviewed and are negative.   Physical Exam Vital Signs  I have reviewed the triage vital signs BP 138/72   Pulse (!) 51   Temp 98.4 F (36.9 C)   Resp 16   Ht 5\' 6"  (1.676 m)   Wt 65.8 kg   SpO2 98%   BMI 23.41 kg/m  Physical Exam Vitals and nursing note reviewed.  Constitutional:      General: He is not in acute distress.    Appearance: Normal appearance.  HENT:     Mouth/Throat:     Mouth: Mucous membranes are moist.  Eyes:     Conjunctiva/sclera: Conjunctivae normal.  Cardiovascular:     Rate and Rhythm: Normal rate and regular rhythm.  Pulmonary:     Effort: Pulmonary effort is normal. No respiratory distress.     Breath sounds: Normal breath sounds.  Abdominal:     General: Abdomen is flat.     Palpations: Abdomen is soft.     Tenderness: There is no abdominal tenderness.  Musculoskeletal:     Right lower leg: No edema.     Left lower leg: No edema.  Skin:    General: Skin is warm  and dry.     Capillary Refill: Capillary refill takes less than 2 seconds.  Neurological:     Mental Status: He is alert and oriented to person, place, and time. Mental status is at baseline.  Psychiatric:        Mood and Affect: Mood normal.        Behavior: Behavior normal.     ED Results and Treatments Labs (all labs ordered are listed, but only abnormal results are displayed) Labs Reviewed  COMPREHENSIVE METABOLIC PANEL - Abnormal; Notable for the following components:      Result Value   Glucose, Bld 117 (*)    Albumin 3.4 (*)    AST 43 (*)    All other components within normal limits  CBC WITH DIFFERENTIAL/PLATELET - Abnormal; Notable for the following components:   Hemoglobin 12.9 (*)    HCT 36.6 (*)    MCV 78.9 (*)    Neutro Abs 7.9 (*)    All other components within normal limits  LIPASE, BLOOD  TROPONIN I (HIGH SENSITIVITY)  TROPONIN I (HIGH SENSITIVITY)                                                                                                                          Radiology DG Chest Port 1 View  Result Date: 10/27/2022 CLINICAL DATA:  Abdominal pain and cough. EXAM: PORTABLE CHEST 1 VIEW COMPARISON:  November 09, 2009 FINDINGS: The heart size  and mediastinal contours are within normal limits. Both lungs are clear. The visualized skeletal structures are unremarkable. IMPRESSION: No active disease. Electronically Signed   By: Aram Candela M.D.   On: 10/27/2022 21:39    Pertinent labs & imaging results that were available during my care of the patient were reviewed by me and considered in my medical decision making (see MDM for details).  Medications Ordered in ED Medications - No data to display                                                                                                                                   Procedures Procedures  (including critical care time)  Medical Decision Making / ED Course   MDM:  77 year old male presenting  with episode of epigastric pain now resolved.  Patient well-appearing, physical exam overall unremarkable.  EKG has some prominent T waves although this was seen on previous EKG and does not appear different.  Unclear cause of symptoms, could represent ACS although no chest pain, will need troponin x 2.  Also could represent episode of gastritis, pancreatitis, gas pain.  Very low concern for acute vascular catastrophe such as aortic pathology, pain resolved.  Given that patient is currently feeling well and has no symptoms, if workup is reassuring, may be stable for discharge.  Will reassess.  Clinical Course as of 10/28/22 1507  Sat Oct 27, 2022  2354 Signed out to Dr. Clayborne Dana pending repeat troponin.  [WS]    Clinical Course User Index [WS] Lonell Grandchild, MD     Additional history obtained: -Additional history obtained from ems -External records from outside source obtained and reviewed including: Chart review including previous notes, labs, imaging, consultation notes including prior visit for dizziness   Lab Tests: -I ordered, reviewed, and interpreted labs.   The pertinent results include:   Labs Reviewed  COMPREHENSIVE METABOLIC PANEL - Abnormal; Notable for the following components:      Result Value   Glucose, Bld 117 (*)    Albumin 3.4 (*)    AST 43 (*)    All other components within normal limits  CBC WITH DIFFERENTIAL/PLATELET - Abnormal; Notable for the following components:   Hemoglobin 12.9 (*)    HCT 36.6 (*)    MCV 78.9 (*)    Neutro Abs 7.9 (*)    All other components within normal limits  LIPASE, BLOOD  TROPONIN I (HIGH SENSITIVITY)  TROPONIN I (HIGH SENSITIVITY)    Notable for normal troponin, borderline anemia, borderline AST elevation  EKG   EKG Interpretation  Date/Time:  Saturday October 27 2022 20:11:54 EDT Ventricular Rate:  63 PR Interval:  146 QRS Duration: 90 QT Interval:  371 QTC Calculation: 380 R Axis:   8 Text Interpretation: Sinus  rhythm Consider left ventricular hypertrophy prominent T waves similar to prior Confirmed by Alvino Blood ([id number]) on 10/27/2022 9:20:04 PM  Imaging Studies ordered: I ordered imaging studies including CXR On my interpretation imaging demonstrates no acute process I independently visualized and interpreted imaging. I agree with the radiologist interpretation   Medicines ordered and prescription drug management: No orders of the defined types were placed in this encounter.   -I have reviewed the patients home medicines and have made adjustments as needed    Cardiac Monitoring: The patient was maintained on a cardiac monitor.  I personally viewed and interpreted the cardiac monitored which showed an underlying rhythm of: NSR  Social Determinants of Health:  Diagnosis or treatment significantly limited by social determinants of health: former smoker   Reevaluation: After the interventions noted above, I reevaluated the patient and found that their symptoms have improved  Co morbidities that complicate the patient evaluation  Past Medical History:  Diagnosis Date   H/O inguinal hernia    s/p repair   H/O prostate cancer    s/p surgical intervention   Hypertension       Dispostion: Disposition decision including need for hospitalization was considered, and patient discharged from emergency department.    Final Clinical Impression(s) / ED Diagnoses Final diagnoses:  Epigastric pain  Chest pain, unspecified type     This chart was dictated using voice recognition software.  Despite best efforts to proofread,  errors can occur which can change the documentation meaning.    Lonell Grandchild, MD 10/28/22 [phone]

## 2022-10-28 LAB — TROPONIN I (HIGH SENSITIVITY): Troponin I (High Sensitivity): 7 ng/L (ref <18)

## 2023-09-05 ENCOUNTER — Other Ambulatory Visit: Payer: Self-pay

## 2023-09-05 ENCOUNTER — Emergency Department (HOSPITAL_COMMUNITY)

## 2023-09-05 ENCOUNTER — Inpatient Hospital Stay (HOSPITAL_COMMUNITY)
Admission: EM | Admit: 2023-09-05 | Discharge: 2023-09-12 | DRG: 854 | Disposition: A | Discharge status: Home / Self Care | Attending: Family Medicine | Admitting: Family Medicine

## 2023-09-05 ENCOUNTER — Encounter (HOSPITAL_COMMUNITY): Payer: Self-pay

## 2023-09-05 DIAGNOSIS — Z79899 Other long term (current) drug therapy: Secondary | ICD-10-CM

## 2023-09-05 DIAGNOSIS — R935 Abnormal findings on diagnostic imaging of other abdominal regions, including retroperitoneum: Secondary | ICD-10-CM | POA: Diagnosis not present

## 2023-09-05 DIAGNOSIS — Z931 Gastrostomy status: Secondary | ICD-10-CM | POA: Diagnosis not present

## 2023-09-05 DIAGNOSIS — E119 Type 2 diabetes mellitus without complications: Secondary | ICD-10-CM | POA: Diagnosis not present

## 2023-09-05 DIAGNOSIS — N390 Urinary tract infection, site not specified: Secondary | ICD-10-CM | POA: Diagnosis not present

## 2023-09-05 DIAGNOSIS — N179 Acute kidney failure, unspecified: Secondary | ICD-10-CM | POA: Diagnosis not present

## 2023-09-05 DIAGNOSIS — K82A2 Perforation of gallbladder in cholecystitis: Secondary | ICD-10-CM | POA: Diagnosis not present

## 2023-09-05 DIAGNOSIS — K82A1 Gangrene of gallbladder in cholecystitis: Secondary | ICD-10-CM | POA: Diagnosis present

## 2023-09-05 DIAGNOSIS — K660 Peritoneal adhesions (postprocedural) (postinfection): Secondary | ICD-10-CM | POA: Diagnosis present

## 2023-09-05 DIAGNOSIS — R140 Abdominal distension (gaseous): Secondary | ICD-10-CM | POA: Diagnosis not present

## 2023-09-05 DIAGNOSIS — K8000 Calculus of gallbladder with acute cholecystitis without obstruction: Secondary | ICD-10-CM | POA: Diagnosis not present

## 2023-09-05 DIAGNOSIS — Z7982 Long term (current) use of aspirin: Secondary | ICD-10-CM | POA: Diagnosis not present

## 2023-09-05 DIAGNOSIS — R531 Weakness: Secondary | ICD-10-CM | POA: Diagnosis present

## 2023-09-05 DIAGNOSIS — Z800 Family history of malignant neoplasm of digestive organs: Secondary | ICD-10-CM | POA: Diagnosis not present

## 2023-09-05 DIAGNOSIS — I10 Essential (primary) hypertension: Secondary | ICD-10-CM | POA: Diagnosis not present

## 2023-09-05 DIAGNOSIS — Z9079 Acquired absence of other genital organ(s): Secondary | ICD-10-CM

## 2023-09-05 DIAGNOSIS — K838 Other specified diseases of biliary tract: Secondary | ICD-10-CM | POA: Diagnosis present

## 2023-09-05 DIAGNOSIS — K810 Acute cholecystitis: Principal | ICD-10-CM | POA: Diagnosis present

## 2023-09-05 DIAGNOSIS — A419 Sepsis, unspecified organism: Principal | ICD-10-CM | POA: Diagnosis present

## 2023-09-05 DIAGNOSIS — Z8546 Personal history of malignant neoplasm of prostate: Secondary | ICD-10-CM

## 2023-09-05 DIAGNOSIS — K7689 Other specified diseases of liver: Secondary | ICD-10-CM | POA: Diagnosis not present

## 2023-09-05 DIAGNOSIS — D72829 Elevated white blood cell count, unspecified: Secondary | ICD-10-CM | POA: Diagnosis not present

## 2023-09-05 DIAGNOSIS — R109 Unspecified abdominal pain: Secondary | ICD-10-CM | POA: Diagnosis not present

## 2023-09-05 DIAGNOSIS — R5383 Other fatigue: Secondary | ICD-10-CM | POA: Diagnosis not present

## 2023-09-05 DIAGNOSIS — J9811 Atelectasis: Secondary | ICD-10-CM | POA: Diagnosis not present

## 2023-09-05 DIAGNOSIS — Z87891 Personal history of nicotine dependence: Secondary | ICD-10-CM | POA: Diagnosis not present

## 2023-09-05 DIAGNOSIS — K828 Other specified diseases of gallbladder: Secondary | ICD-10-CM | POA: Diagnosis not present

## 2023-09-05 DIAGNOSIS — K5900 Constipation, unspecified: Secondary | ICD-10-CM | POA: Diagnosis not present

## 2023-09-05 DIAGNOSIS — Z4682 Encounter for fitting and adjustment of non-vascular catheter: Secondary | ICD-10-CM | POA: Diagnosis not present

## 2023-09-05 DIAGNOSIS — C61 Malignant neoplasm of prostate: Secondary | ICD-10-CM | POA: Diagnosis present

## 2023-09-05 DIAGNOSIS — I517 Cardiomegaly: Secondary | ICD-10-CM | POA: Diagnosis not present

## 2023-09-05 DIAGNOSIS — N281 Cyst of kidney, acquired: Secondary | ICD-10-CM | POA: Diagnosis not present

## 2023-09-05 DIAGNOSIS — K567 Ileus, unspecified: Secondary | ICD-10-CM | POA: Diagnosis not present

## 2023-09-05 LAB — URINALYSIS, ROUTINE W REFLEX MICROSCOPIC
Bilirubin Urine: NEGATIVE
Glucose, UA: NEGATIVE mg/dL
Hgb urine dipstick: NEGATIVE
Ketones, ur: NEGATIVE mg/dL
Nitrite: POSITIVE — AB
Protein, ur: 100 mg/dL — AB
Specific Gravity, Urine: >1.046 — ABNORMAL HIGH (ref 1.005–1.030)
pH: 5 (ref 5.0–8.0)

## 2023-09-05 LAB — CBC WITH DIFFERENTIAL/PLATELET
Abs Immature Granulocytes: 0.25 10*3/uL — ABNORMAL HIGH (ref 0.00–0.07)
Basophils Absolute: 0 10*3/uL (ref 0.0–0.1)
Basophils Relative: 0 %
Eosinophils Absolute: 0 10*3/uL (ref 0.0–0.5)
Eosinophils Relative: 0 %
HCT: 41 % (ref 39.0–52.0)
Hemoglobin: 14.5 g/dL (ref 13.0–17.0)
Immature Granulocytes: 1 %
Lymphocytes Relative: 5 %
Lymphs Abs: 1 10*3/uL (ref 0.7–4.0)
MCH: 28 pg (ref 26.0–34.0)
MCHC: 35.4 g/dL (ref 30.0–36.0)
MCV: 79.2 fL — ABNORMAL LOW (ref 80.0–100.0)
Monocytes Absolute: 1.5 10*3/uL — ABNORMAL HIGH (ref 0.1–1.0)
Monocytes Relative: 7 %
Neutro Abs: 17.4 10*3/uL — ABNORMAL HIGH (ref 1.7–7.7)
Neutrophils Relative %: 87 %
Platelets: 239 10*3/uL (ref 150–400)
RBC: 5.18 MIL/uL (ref 4.22–5.81)
RDW: 14 % (ref 11.5–15.5)
WBC: 20.1 10*3/uL — ABNORMAL HIGH (ref 4.0–10.5)
nRBC: 0 % (ref 0.0–0.2)

## 2023-09-05 LAB — COMPREHENSIVE METABOLIC PANEL WITH GFR
ALT: 46 U/L — ABNORMAL HIGH (ref 0–44)
AST: 39 U/L (ref 15–41)
Albumin: 3.7 g/dL (ref 3.5–5.0)
Alkaline Phosphatase: 74 U/L (ref 38–126)
Anion gap: 11 (ref 5–15)
BUN: 31 mg/dL — ABNORMAL HIGH (ref 8–23)
CO2: 26 mmol/L (ref 22–32)
Calcium: 10 mg/dL (ref 8.9–10.3)
Chloride: 98 mmol/L (ref 98–111)
Creatinine, Ser: 1.17 mg/dL (ref 0.61–1.24)
GFR, Estimated: >60 mL/min (ref >60)
Glucose, Bld: 179 mg/dL — ABNORMAL HIGH (ref 70–99)
Potassium: 3.9 mmol/L (ref 3.5–5.1)
Sodium: 135 mmol/L (ref 135–145)
Total Bilirubin: 1.9 mg/dL — ABNORMAL HIGH (ref 0.0–1.2)
Total Protein: 8 g/dL (ref 6.5–8.1)

## 2023-09-05 LAB — CBG MONITORING, ED: Glucose-Capillary: 199 mg/dL — ABNORMAL HIGH (ref 70–99)

## 2023-09-05 LAB — LACTIC ACID, PLASMA: Lactic Acid, Venous: 1.2 mmol/L (ref 0.5–1.9)

## 2023-09-05 LAB — LIPASE, BLOOD: Lipase: 28 U/L (ref 11–51)

## 2023-09-05 MED ORDER — ONDANSETRON HCL 4 MG PO TABS: 4.0000 mg | ORAL_TABLET | Freq: Four times a day (QID) | ORAL | Status: DC | PRN

## 2023-09-05 MED ORDER — ONDANSETRON HCL 4 MG/2ML IJ SOLN
4.0000 mg | Freq: Four times a day (QID) | INTRAMUSCULAR | Status: DC | PRN
  Administered 2023-09-06 – 2023-09-07 (×3): 4 mg via INTRAVENOUS
  Filled 2023-09-05 (×2): qty 2

## 2023-09-05 MED ORDER — SODIUM CHLORIDE 0.9 % IV SOLN: INTRAVENOUS | Status: AC

## 2023-09-05 MED ORDER — PIPERACILLIN-TAZOBACTAM 3.375 G IVPB
3.3750 g | Freq: Three times a day (TID) | INTRAVENOUS | Status: AC
  Administered 2023-09-06 – 2023-09-11 (×17): 3.375 g via INTRAVENOUS
  Filled 2023-09-05 (×18): qty 50

## 2023-09-05 MED ORDER — ACETAMINOPHEN 650 MG RE SUPP: 650.0000 mg | Freq: Four times a day (QID) | RECTAL | Status: DC | PRN

## 2023-09-05 MED ORDER — IOHEXOL 300 MG/ML  SOLN
100.0000 mL | Freq: Once | INTRAMUSCULAR | Status: AC | PRN
  Administered 2023-09-05: 100 mL via INTRAVENOUS

## 2023-09-05 MED ORDER — POLYETHYLENE GLYCOL 3350 17 G PO PACK: 17.0000 g | PACK | Freq: Every day | ORAL | Status: DC | PRN

## 2023-09-05 MED ORDER — DILTIAZEM HCL ER COATED BEADS 180 MG PO CP24
360.0000 mg | ORAL_CAPSULE | Freq: Every day | ORAL | Status: DC
  Administered 2023-09-05 – 2023-09-07 (×3): 360 mg via ORAL
  Filled 2023-09-05 (×4): qty 2

## 2023-09-05 MED ORDER — MORPHINE SULFATE (PF) 2 MG/ML IV SOLN: 2.0000 mg | INTRAVENOUS | Status: DC | PRN

## 2023-09-05 MED ORDER — LORAZEPAM 2 MG/ML IJ SOLN: 0.5000 mg | Freq: Once | INTRAMUSCULAR | Status: DC

## 2023-09-05 MED ORDER — ACETAMINOPHEN 325 MG PO TABS
650.0000 mg | ORAL_TABLET | Freq: Four times a day (QID) | ORAL | Status: DC | PRN
  Administered 2023-09-06 – 2023-09-10 (×5): 650 mg via ORAL
  Filled 2023-09-05 (×5): qty 2

## 2023-09-05 MED ORDER — PIPERACILLIN-TAZOBACTAM 3.375 G IVPB 30 MIN
3.3750 g | Freq: Four times a day (QID) | INTRAVENOUS | Status: DC
  Administered 2023-09-05: 3.375 g via INTRAVENOUS
  Filled 2023-09-05: qty 50

## 2023-09-05 MED ORDER — ONDANSETRON HCL 4 MG/2ML IJ SOLN
4.0000 mg | Freq: Once | INTRAMUSCULAR | Status: AC
  Administered 2023-09-05: 4 mg via INTRAVENOUS
  Filled 2023-09-05: qty 2

## 2023-09-05 NOTE — ED Triage Notes (Signed)
 Pt arrived via POV c/o increasing weakness for past several days. Pt endorses decreased appetite. Pt reports last BM yesterday morning, and his urine has been darker than normal. Pt reports fatigue, and denies visual changes.

## 2023-09-05 NOTE — ED Provider Notes (Signed)
 [place] EMERGENCY DEPARTMENT AT [facility] Provider Note   CSN: [id number] Arrival date & time: 09/05/23  1559     History  Chief Complaint  Patient presents with   Weakness    Antonio Hughes is a 78 y.o. male with PMH as listed below who presents with increasing weakness since Monday. Pt endorses decreased appetite, nausea/vomiting, and R-sided abdominal pain. Pt reports last BM yesterday morning, and his urine has been darker than normal. Pt reports fatigue. Denies flank pain, h/o kidney stones. Endorses fever at home that broke yesterday.    Past Medical History:  Diagnosis Date   H/O inguinal hernia    s/p repair   H/O prostate cancer    s/p surgical intervention   Hypertension        Home Medications Prior to Admission medications   Medication Sig Start Date End Date Taking? Authorizing Provider  aspirin 81 MG tablet Take 81 mg by mouth daily.    [provider]  chlorthalidone  (HYGROTON ) 25 MG tablet Take 12.5 mg by mouth every other day.    [provider]  diltiazem  (CARDIZEM  CD) 360 MG 24 hr capsule Take 360 mg by mouth daily. 07/25/23   [provider]  diltiazem  (TIAZAC ) 360 MG 24 hr capsule Take 360 mg by mouth daily. 06/03/23   [provider]  docusate sodium  100 MG CAPS Take 100 mg by mouth daily. 05/15/13   Christella Coventry, MD  losartan (COZAAR) 25 MG tablet Take 25 mg by mouth daily. 08/28/23   [provider]  potassium chloride  (KLOR-CON  M) 10 MEQ tablet Take 10 mEq by mouth daily. 07/25/23   [provider]      Allergies    Patient has no known allergies.    Review of Systems   Review of Systems A 10 point review of systems was performed and is negative unless otherwise reported in HPI.  Physical Exam Updated Vital Signs BP 138/83 (BP Location: Left Arm)   Pulse 92   Temp 100.1 F (37.8 C) (Oral)   Resp 16   Ht 5\' 6"  (1.676 m)   Wt 65.8 kg   SpO2 92%   BMI 23.41 kg/m   Physical Exam General: Normal appearing male, lying in bed.  HEENT: PERRLA, Sclera anicteric, MMM, trachea midline.  Cardiology: RRR, no murmurs/rubs/gallops.  Resp: Normal respiratory rate and effort. CTAB, no wheezes, rhonchi, crackles.  Abd: +RUQ TTP and +murphy's sign. Soft,  non-distended. No rebound tenderness or guarding.  GU: Deferred. MSK: No peripheral edema or signs of trauma. Extremities without deformity or TTP. No cyanosis or clubbing. Skin: warm, dry.  Back: No CVA tenderness Neuro: A&Ox4, CNs II-XII grossly intact. MAEs. Sensation grossly intact.  Psych: Normal mood and affect.   ED Results / Procedures / Treatments   Labs (all labs ordered are listed, but only abnormal results are displayed) Labs Reviewed  COMPREHENSIVE METABOLIC PANEL WITH GFR - Abnormal; Notable for the following components:      Result Value   Glucose, Bld 179 (*)    BUN 31 (*)    ALT 46 (*)    Total Bilirubin 1.9 (*)    All other components within normal limits  CBC WITH DIFFERENTIAL/PLATELET - Abnormal; Notable for the following components:   WBC 20.1 (*)    MCV 79.2 (*)    Neutro Abs 17.4 (*)    Monocytes Absolute 1.5 (*)    Abs Immature Granulocytes 0.25 (*)    All  other components within normal limits  CBG MONITORING, ED - Abnormal; Notable for the following components:   Glucose-Capillary 199 (*)    All other components within normal limits  LIPASE, BLOOD  URINALYSIS, ROUTINE W REFLEX MICROSCOPIC    EKG None  Radiology CT ABDOMEN PELVIS W CONTRAST Result Date: 09/05/2023 CLINICAL DATA:  Anorexia and weakness. EXAM: CT ABDOMEN AND PELVIS WITH CONTRAST TECHNIQUE: Multidetector CT imaging of the abdomen and pelvis was performed using the standard protocol following bolus administration of intravenous contrast. RADIATION DOSE REDUCTION: This exam was performed according to the departmental dose-optimization program which includes automated exposure control, adjustment of the mA and/or  kV according to patient size and/or use of iterative reconstruction technique. CONTRAST:  OMNIPAQUE  IOHEXOL  300 MG/ML  SOLN COMPARISON:  09/01/2009. FINDINGS: Lower chest: No acute abnormality. No pleural or pericardial effusions. Hepatobiliary: Pericholecystic fluid and the CBD dilatation. No intrahepatic biliary ductal dilatation. No hepatic parenchymal abnormalities. Pancreas: Unremarkable. No pancreatic ductal dilatation or surrounding inflammatory changes. Spleen: Normal in size without focal abnormality. Adrenals/Urinary Tract: No adrenal lesions. There are couple of mm sized hypodensities in left kidney consistent with cyst. No nephrolithiasis or hydronephrosis. Unremarkable urinary bladder. Stomach/Bowel: Stomach is within normal limits. Appendix appears normal. No evidence of bowel wall thickening, distention, or inflammatory changes. Vascular/Lymphatic: Aortic atherosclerosis. No enlarged abdominal or pelvic lymph nodes. Reproductive: Postop prostatectomy. Other: No abdominal wall hernia or abnormality. No abdominopelvic ascites. Musculoskeletal: Thoracolumbosacral degenerative changes. IMPRESSION: 1. Pericholecystic fluid and CBD dilatation. Recommend ultrasound evaluation. 2. Left renal milimeter-sized cysts. 3. Aortic atherosclerosis (ICD10-I70.0). Electronically Signed   By: Sydell Eva M.D.   On: 09/05/2023 19:22   DG Abdomen 1 View Result Date: 09/05/2023 CLINICAL DATA:  Abdominal pain and constipation EXAM: ABDOMEN - 1 VIEW COMPARISON:  CT pelvis 09/01/2009 FINDINGS: Formed stool in the rectal vault with gas in the sigmoid colon, descending colon, distal transverse colon. Paucity of bowel gas in the right abdomen. Clustered loops of small bowel in the central to left abdomen measure up to 3 cm in diameter, borderline dilated. Levoconvex lumbar scoliosis with rotary component. Lumbar spondylosis with loss of intervertebral disc height in the upper lumbar spine. Suspected spurring along  the sacroiliac joints. IMPRESSION: 1. Formed stool in the rectal vault with gas in the sigmoid colon, descending colon, and distal transverse colon. 2. Clustered loops of small bowel in the central to left abdomen measure up to 3 cm in diameter, borderline dilated. This is nonspecific and could be due to ileus or partial small bowel obstruction. 3. Levoconvex lumbar scoliosis with rotary component. Lumbar spondylosis. Electronically Signed   By: Freida Jes M.D.   On: 09/05/2023 17:19    Procedures Procedures    Medications Ordered in ED Medications  piperacillin -tazobactam (ZOSYN ) IVPB 3.375 g (3.375 g Intravenous New Bag/Given 09/05/23 1957)  iohexol  (OMNIPAQUE ) 300 MG/ML solution 100 mL (100 mLs Intravenous Contrast Given 09/05/23 1833)  ondansetron  (ZOFRAN ) injection 4 mg (4 mg Intravenous Given 09/05/23 1853)    ED Course/ Medical Decision Making/ A&P                          Medical Decision Making Amount and/or Complexity of Data Reviewed Labs: ordered. Decision-making details documented in ED Course. Radiology: ordered. Decision-making details documented in ED Course.  Risk Prescription drug management. Decision regarding hospitalization.    This patient presents to the ED for concern of anorexia, fever, nausea vomiting, right upper quadrant abdominal pain,  dark urine, this involves an extensive number of treatment options, and is a complaint that carries with it a high risk of complications and morbidity.  I considered the following differential and admission for this acute, potentially life threatening condition.  Patient with low-grade fever and is otherwise hemodynamically stable, well-appearing, nontoxic.  MDM:    Low concern for sepsis in this patient.  Patient with leukocytosis, fever, that upper quadrant tenderness with positive Murphy sign.  Patient has dark urine.  He was concerned about a urinary tract infection.  He is admitted with urine Alysis still pending  however with pericholecystic fluid and CBD dilation noted on the CT ab and pelvis greatest concern for cholecystitis.  Patient is given Zosyn  and consulted to surgery, Dr. Collene Dawson will plan on cholecystectomy tomorrow.  Admitted to medicine.  Clinical Course as of 09/05/23 2024  Thu Sep 05, 2023  1738 WBC(!): 20.1 +leukocytosis w/ left shift [HN]  1741 DG Abdomen 1 View 1. Formed stool in the rectal vault with gas in the sigmoid colon, descending colon, and distal transverse colon. 2. Clustered loops of small bowel in the central to left abdomen measure up to 3 cm in diameter, borderline dilated. This is nonspecific and could be due to ileus or partial small bowel obstruction. 3. Levoconvex lumbar scoliosis with rotary component. Lumbar spondylosis.   [HN]  1938 CT ABDOMEN PELVIS W CONTRAST 1. Pericholecystic fluid and CBD dilatation. Recommend ultrasound evaluation. 2. Left renal milimeter-sized cysts. 3. Aortic atherosclerosis (ICD10-I70.0).   [HN]  1938 No US  available here now. +RUQ TTP and +murphy's sign. C/f acute cholecystitis. Will give zosyn  and consult with surgery. [HN]    Clinical Course User Index [HN] Merdis Stalling, MD    Labs: I Ordered, and personally interpreted labs.  The pertinent results include: Those listed above  Imaging Studies ordered: I ordered imaging studies including CT abdomen pelvis I independently visualized and interpreted imaging. I agree with the radiologist interpretation  Additional history obtained from chart review, wife at bedside  Reevaluation: After the interventions noted above, I reevaluated the patient and found that they have :improved  Social Determinants of Health:  lives independently  Disposition: Admitted to medicine with surgery following  Co morbidities that complicate the patient evaluation  Past Medical History:  Diagnosis Date   H/O inguinal hernia    s/p repair   H/O prostate cancer    s/p surgical  intervention   Hypertension      Medicines Meds ordered this encounter  Medications   iohexol  (OMNIPAQUE ) 300 MG/ML solution 100 mL   ondansetron  (ZOFRAN ) injection 4 mg   piperacillin -tazobactam (ZOSYN ) IVPB 3.375 g    Antibiotic Indication::   Intra-abdominal Infection    I have reviewed the patients home medicines and have made adjustments as needed  Problem List / ED Course: Problem List Items Addressed This Visit       Digestive   * (Principal) Acute cholecystitis - Primary                This note was created using dictation software, which may contain spelling or grammatical errors.    Merdis Stalling, MD 09/05/23 2026

## 2023-09-05 NOTE — Assessment & Plan Note (Addendum)
 Meeting sepsis criteria with fever of 100.1, leukocytosis of 20.1.  T. bili elevated at 1.9 otherwise normal liver enzymes.  Normal lipase 28.  CT abdomen and pelvis with contrast- Pericholecystic fluid and the CBD dilatation. No intrahepatic biliary ductal dilatation. No hepatic parenchymal abnormalities.  Ultrasound evaluation recommended. -General Surgeon Dr. Collene Dawson consulted by EDP, but patient declined admission here at [facility]. No other reason than he would rather have surgery at [facility]. -I talked to general surgeon Dr. Camilo Cella at [facility], patient will not have surgery till next week- 5th or 6th, agrees with MRCP. I talked to patient again and he decided to stay here at [facility]. -With CBD dilatation, will order MRCP for a.m. -Zosyn  started -N.p.o. midnight -N/s 100cc/hr x 15hrs - CBC, CMP a.m.

## 2023-09-05 NOTE — ED Notes (Signed)
 Patient transported to room 322 via wc by Carolyn Cisco, Rn. Patient a&o, able to transfer to wc slowly but independently. No respiratory distress noted on room air. IVF infusing to RAC.

## 2023-09-05 NOTE — Assessment & Plan Note (Signed)
 Sepsis criteria with leukocytosis of 20.1, fever of 100.1.  Likely secondary to acute cholecystitis, UTI also possible.  With UA showing nitrites and leukocytes and many bacteria. -Lactic acid -Obtain blood cultures -Continue Zosyn  -Add-on urine cultures -Hydrate

## 2023-09-05 NOTE — Assessment & Plan Note (Signed)
 Stable. -Hold chlorthalidone  to allow for hydration -Resume diltiazem 

## 2023-09-05 NOTE — H&P (Signed)
 History and Physical    Antonio Hughes AOZ:[id number] DOB: 1945/07/05 DOA: 09/05/2023  PCP: Wyvonna Heidelberg, MD   Patient coming from: Home  I have personally briefly reviewed patient's old medical records in [facility]  Chief Complaint: Abdominal Pain, fever  HPI: Antonio Hughes is a 78 y.o. male with medical history significant for hypertension, prostate cancer.  Patient presented to the ED with complaints of generalized weakness, poor oral intake, right upper quadrant pain over the past 4 days.  Vomited once here in the ED.  No diarrhea.  Reports urine is very dark, he denies urinary symptoms.  ED Course: Tmax 100.1, heart rate 85-92.  Respiratory rate 16-20.  Blood pressure systolic 128-141.  O2 sats 92 to 96% on room air. WBC 20.1.  T. bili 1.9.  AST 39, ALT 46, ALP 74.  Lipase normal 28. EDP talked to general surgeon Dr. Collene Dawson, will see in consult tomorrow for surgery.  Hospitalist to admit.  Review of Systems: As per HPI all other systems reviewed and negative.  Past Medical History:  Diagnosis Date   H/O inguinal hernia    s/p repair   H/O prostate cancer    s/p surgical intervention   Hypertension     Past Surgical History:  Procedure Laterality Date   COLONOSCOPY N/A 12/15/2015   Procedure: COLONOSCOPY;  Surgeon: Suzette Espy, MD;  Location: [facility] SU[facility];  Service: Endoscopy;  Laterality: N/A;  930   INGUINAL HERNIA REPAIR Right 05/14/2013   Procedure: HERNIA REPAIR INGUINAL ADULT;  Surgeon: Myrl Askew, MD;  Location: [facility] ORS;  Service: General;  Laterality: Right;   PROSTATE SURGERY  2011     reports that he quit smoking about 52 years ago. His smoking use included cigarettes. He started smoking about 62 years ago. He has a 50 pack-year smoking history. He has been exposed to tobacco smoke. He has never used smokeless tobacco. He reports that he does not drink alcohol and does not use drugs.  No Known Allergies  Family History   Problem Relation Age of Onset   Esophageal cancer Father    Colon cancer Neg Hx     Prior to Admission medications   Medication Sig Start Date End Date Taking? Authorizing Provider  aspirin 81 MG tablet Take 81 mg by mouth daily.    [provider]  chlorthalidone  (HYGROTON ) 25 MG tablet Take 12.5 mg by mouth every other day.    [provider]  diltiazem  (CARDIZEM  CD) 360 MG 24 hr capsule Take 360 mg by mouth daily. 07/25/23   [provider]  diltiazem  (TIAZAC ) 360 MG 24 hr capsule Take 360 mg by mouth daily. 06/03/23   [provider]  docusate sodium  100 MG CAPS Take 100 mg by mouth daily. 05/15/13   Christella Coventry, MD  losartan (COZAAR) 25 MG tablet Take 25 mg by mouth daily. 08/28/23   [provider]  potassium chloride  (KLOR-CON  M) 10 MEQ tablet Take 10 mEq by mouth daily. 07/25/23   [provider]    Physical Exam: Vitals:   09/05/23 1615 09/05/23 1619 09/05/23 1949 09/05/23 1950  BP: 128/79  138/83   Pulse: 85  92   Resp: 20  16 16   Temp: 98.9 F (37.2 C)  100.1 F (37.8 C) 100.1 F (37.8 C)  TempSrc:   Oral Oral  SpO2: 96%  92%   Weight:  65.8 kg    Height:  5\' 6"  (1.676 m)  Constitutional: NAD, calm, comfortable Vitals:   09/05/23 1615 09/05/23 1619 09/05/23 1949 09/05/23 1950  BP: 128/79  138/83   Pulse: 85  92   Resp: 20  16 16   Temp: 98.9 F (37.2 C)  100.1 F (37.8 C) 100.1 F (37.8 C)  TempSrc:   Oral Oral  SpO2: 96%  92%   Weight:  65.8 kg    Height:  5\' 6"  (1.676 m)     Eyes: PERRL, lids and conjunctivae normal ENMT: Mucous membranes are moist.   Neck: normal, supple, no masses, no thyromegaly Respiratory: clear to auscultation bilaterally, no wheezing, no crackles. Normal respiratory effort. No accessory muscle use.  Cardiovascular: Regular rate and rhythm, no murmurs / rubs / gallops. No extremity edema.  Extremities warm. Abdomen: moderate tenderness mid and right side of abdomen, soft,  no masses palpated.  Musculoskeletal: no clubbing / cyanosis. No joint deformity upper and lower extremities.  Skin: no rashes, lesions, ulcers. No induration Neurologic: No facial asymmetry, moving extremities spontaneously, speech fluent.  Psychiatric: Normal judgment and insight. Alert and oriented x 3. Normal mood.   Labs on Admission: I have personally reviewed following labs and imaging studies  CBC: Recent Labs  Lab 09/05/23 1646  WBC 20.1*  NEUTROABS 17.4*  HGB 14.5  HCT 41.0  MCV 79.2*  PLT 239   Basic Metabolic Panel: Recent Labs  Lab 09/05/23 1646  NA 135  K 3.9  CL 98  CO2 26  GLUCOSE 179*  BUN 31*  CREATININE 1.17  CALCIUM 10.0   GFR: Estimated Creatinine Clearance: 47.7 mL/min (by C-G formula based on SCr of 1.17 mg/dL). Liver Function Tests: Recent Labs  Lab 09/05/23 1646  AST 39  ALT 46*  ALKPHOS 74  BILITOT 1.9*  PROT 8.0  ALBUMIN 3.7   Recent Labs  Lab 09/05/23 1646  LIPASE 28   CBG: Recent Labs  Lab 09/05/23 1615  GLUCAP 199*   Urine analysis:    Component Value Date/Time   COLORURINE YELLOW 09/13/2021 1604   APPEARANCEUR HAZY (A) 09/13/2021 1604   LABSPEC 1.016 09/13/2021 1604   PHURINE 7.0 09/13/2021 1604   GLUCOSEU NEGATIVE 09/13/2021 1604   HGBUR NEGATIVE 09/13/2021 1604   BILIRUBINUR NEGATIVE 09/13/2021 1604   KETONESUR NEGATIVE 09/13/2021 1604   PROTEINUR NEGATIVE 09/13/2021 1604   NITRITE NEGATIVE 09/13/2021 1604   LEUKOCYTESUR NEGATIVE 09/13/2021 1604    Radiological Exams on Admission: CT ABDOMEN PELVIS W CONTRAST Result Date: 09/05/2023 CLINICAL DATA:  Anorexia and weakness. EXAM: CT ABDOMEN AND PELVIS WITH CONTRAST TECHNIQUE: Multidetector CT imaging of the abdomen and pelvis was performed using the standard protocol following bolus administration of intravenous contrast. RADIATION DOSE REDUCTION: This exam was performed according to the departmental dose-optimization program which includes automated exposure  control, adjustment of the mA and/or kV according to patient size and/or use of iterative reconstruction technique. CONTRAST:  OMNIPAQUE  IOHEXOL  300 MG/ML  SOLN COMPARISON:  09/01/2009. FINDINGS: Lower chest: No acute abnormality. No pleural or pericardial effusions. Hepatobiliary: Pericholecystic fluid and the CBD dilatation. No intrahepatic biliary ductal dilatation. No hepatic parenchymal abnormalities. Pancreas: Unremarkable. No pancreatic ductal dilatation or surrounding inflammatory changes. Spleen: Normal in size without focal abnormality. Adrenals/Urinary Tract: No adrenal lesions. There are couple of mm sized hypodensities in left kidney consistent with cyst. No nephrolithiasis or hydronephrosis. Unremarkable urinary bladder. Stomach/Bowel: Stomach is within normal limits. Appendix appears normal. No evidence of bowel wall thickening, distention, or inflammatory changes. Vascular/Lymphatic: Aortic atherosclerosis. No enlarged abdominal or pelvic  lymph nodes. Reproductive: Postop prostatectomy. Other: No abdominal wall hernia or abnormality. No abdominopelvic ascites. Musculoskeletal: Thoracolumbosacral degenerative changes. IMPRESSION: 1. Pericholecystic fluid and CBD dilatation. Recommend ultrasound evaluation. 2. Left renal milimeter-sized cysts. 3. Aortic atherosclerosis (ICD10-I70.0). Electronically Signed   By: Sydell Eva M.D.   On: 09/05/2023 19:22   DG Abdomen 1 View Result Date: 09/05/2023 CLINICAL DATA:  Abdominal pain and constipation EXAM: ABDOMEN - 1 VIEW COMPARISON:  CT pelvis 09/01/2009 FINDINGS: Formed stool in the rectal vault with gas in the sigmoid colon, descending colon, distal transverse colon. Paucity of bowel gas in the right abdomen. Clustered loops of small bowel in the central to left abdomen measure up to 3 cm in diameter, borderline dilated. Levoconvex lumbar scoliosis with rotary component. Lumbar spondylosis with loss of intervertebral disc height in the upper  lumbar spine. Suspected spurring along the sacroiliac joints. IMPRESSION: 1. Formed stool in the rectal vault with gas in the sigmoid colon, descending colon, and distal transverse colon. 2. Clustered loops of small bowel in the central to left abdomen measure up to 3 cm in diameter, borderline dilated. This is nonspecific and could be due to ileus or partial small bowel obstruction. 3. Levoconvex lumbar scoliosis with rotary component. Lumbar spondylosis. Electronically Signed   By: Freida Jes M.D.   On: 09/05/2023 17:19   WUJ:[id number].   Assessment/Plan Principal Problem:   Acute cholecystitis Active Problems:   HTN (hypertension)  Assessment and Plan: * Acute cholecystitis Meeting sepsis criteria with fever of 100.1, leukocytosis of 20.1.  T. bili elevated at 1.9 otherwise normal liver enzymes.  Normal lipase 28.  CT abdomen and pelvis with contrast- Pericholecystic fluid and the CBD dilatation. No intrahepatic biliary ductal dilatation. No hepatic parenchymal abnormalities.  Ultrasound evaluation recommended. -General Surgeon Dr. Collene Dawson consulted by EDP, but patient declined admission here at [facility]. No other reason than he would rather have surgery at [facility]. -I talked to general surgeon Dr. Camilo Cella at [facility], patient will not have surgery till next week- 5th or 6th, agrees with MRCP. I talked to patient again and he decided to stay here at [facility]. -With CBD dilatation, will order MRCP for a.m. -Zosyn  started -N.p.o. midnight -N/s 100cc/hr x 15hrs - CBC, CMP a.m.  Sepsis (HCC) Sepsis criteria with leukocytosis of 20.1, fever of 100.1.  Likely secondary to acute cholecystitis, UTI also possible.  With UA showing nitrites and leukocytes and many bacteria. -Lactic acid -Obtain blood cultures -Continue Zosyn  -Add-on urine cultures -Hydrate  Prostate cancer [facility]) Status post prostatectomy.  HTN (hypertension) Stable. -Hold chlorthalidone  to allow for  hydration -Resume diltiazem    DVT prophylaxis: SCDS Code Status: FULL code Family Communication:Spouse at bedside Disposition Plan: > 2 days Consults called: Gen Surg Admission status: Inpt Med surg I certify that at the point of admission it is my clinical judgment that the patient will require inpatient hospital care spanning beyond 2 midnights from the point of admission due to high intensity of service, high risk for further deterioration and high frequency of surveillance required.    Author: Pati Bonine, MD 09/05/2023 10:06 PM  For on call review [email or web address].

## 2023-09-05 NOTE — Progress Notes (Signed)
 Pharmacy Antibiotic Note  ARGIE SILL is a 78 y.o. male admitted on 09/05/2023 with acute cholecystitis.  Pharmacy has been consulted for Zosyn  dosing.  Plan: Zosyn  3.375g IV q8h (4 hour infusion).  Height: 5\' 6"  (167.6 cm) Weight: 65.8 kg (145 lb 1 oz) IBW/kg (Calculated) : 63.8  Temp (24hrs), Avg:99.7 F (37.6 C), Min:98.9 F (37.2 C), Max:100.1 F (37.8 C)  Recent Labs  Lab 09/05/23 1646  WBC 20.1*  CREATININE 1.17    Estimated Creatinine Clearance: 47.7 mL/min (by C-G formula based on SCr of 1.17 mg/dL).    No Known Allergies   Thank you for allowing pharmacy to be a part of this patient's care.  Lonnie Roberts, PharmD, BCPS  09/05/2023 10:25 PM

## 2023-09-05 NOTE — Assessment & Plan Note (Signed)
 Status post prostatectomy

## 2023-09-05 NOTE — Progress Notes (Signed)
   09/05/23 2213  TOC Brief Assessment  Insurance and Status Reviewed  Patient has primary care physician Yes  Home environment has been reviewed From home  Prior level of function: Independent  Prior/Current Home Services No current home services  Social Drivers of Health Review SDOH reviewed no interventions necessary  Readmission risk has been reviewed Yes  Transition of care needs no transition of care needs at this time   Transition of Care Department [facility]) has reviewed patient and no other TOC needs have been identified at this time. We will continue to monitor patient advancement through interdisciplinary progression rounds. If new patient needs arise, please place a TOC consult.

## 2023-09-06 ENCOUNTER — Encounter (HOSPITAL_COMMUNITY)
Admission: EM | Disposition: A | Discharge status: Home / Self Care | Payer: Self-pay | Source: Home / Self Care | Attending: Internal Medicine

## 2023-09-06 ENCOUNTER — Inpatient Hospital Stay (HOSPITAL_COMMUNITY): Admitting: Anesthesiology

## 2023-09-06 ENCOUNTER — Inpatient Hospital Stay (HOSPITAL_COMMUNITY)

## 2023-09-06 ENCOUNTER — Encounter (HOSPITAL_COMMUNITY): Payer: Self-pay | Admitting: Internal Medicine

## 2023-09-06 ENCOUNTER — Other Ambulatory Visit: Payer: Self-pay

## 2023-09-06 DIAGNOSIS — Z87891 Personal history of nicotine dependence: Secondary | ICD-10-CM | POA: Diagnosis not present

## 2023-09-06 DIAGNOSIS — K810 Acute cholecystitis: Secondary | ICD-10-CM | POA: Diagnosis not present

## 2023-09-06 DIAGNOSIS — E119 Type 2 diabetes mellitus without complications: Secondary | ICD-10-CM | POA: Diagnosis not present

## 2023-09-06 DIAGNOSIS — K82A2 Perforation of gallbladder in cholecystitis: Secondary | ICD-10-CM | POA: Insufficient documentation

## 2023-09-06 DIAGNOSIS — K838 Other specified diseases of biliary tract: Secondary | ICD-10-CM | POA: Diagnosis not present

## 2023-09-06 DIAGNOSIS — I10 Essential (primary) hypertension: Secondary | ICD-10-CM | POA: Diagnosis not present

## 2023-09-06 HISTORY — PX: LAPAROSCOPIC LYSIS OF ADHESIONS: SHX5905

## 2023-09-06 LAB — COMPREHENSIVE METABOLIC PANEL WITH GFR
ALT: 44 U/L (ref 0–44)
AST: 33 U/L (ref 15–41)
Albumin: 3.2 g/dL — ABNORMAL LOW (ref 3.5–5.0)
Alkaline Phosphatase: 78 U/L (ref 38–126)
Anion gap: 10 (ref 5–15)
BUN: 30 mg/dL — ABNORMAL HIGH (ref 8–23)
CO2: 25 mmol/L (ref 22–32)
Calcium: 9.6 mg/dL (ref 8.9–10.3)
Chloride: 100 mmol/L (ref 98–111)
Creatinine, Ser: 1.1 mg/dL (ref 0.61–1.24)
GFR, Estimated: >60 mL/min (ref >60)
Glucose, Bld: 173 mg/dL — ABNORMAL HIGH (ref 70–99)
Potassium: 3.6 mmol/L (ref 3.5–5.1)
Sodium: 135 mmol/L (ref 135–145)
Total Bilirubin: 1.9 mg/dL — ABNORMAL HIGH (ref 0.0–1.2)
Total Protein: 7.4 g/dL (ref 6.5–8.1)

## 2023-09-06 LAB — CBC
HCT: 37.6 % — ABNORMAL LOW (ref 39.0–52.0)
Hemoglobin: 13.4 g/dL (ref 13.0–17.0)
MCH: 28.2 pg (ref 26.0–34.0)
MCHC: 35.6 g/dL (ref 30.0–36.0)
MCV: 79 fL — ABNORMAL LOW (ref 80.0–100.0)
Platelets: 207 10*3/uL (ref 150–400)
RBC: 4.76 MIL/uL (ref 4.22–5.81)
RDW: 13.7 % (ref 11.5–15.5)
WBC: 20.7 10*3/uL — ABNORMAL HIGH (ref 4.0–10.5)
nRBC: 0 % (ref 0.0–0.2)

## 2023-09-06 SURGERY — CHOLECYSTECTOMY, ROBOT-ASSISTED, LAPAROSCOPIC
Anesthesia: General | Site: Abdomen

## 2023-09-06 MED ORDER — INDOCYANINE GREEN 25 MG IV SOLR
2.5000 mg | Freq: Once | INTRAVENOUS | Status: AC
  Administered 2023-09-06: 2.5 mg via INTRAVENOUS
  Filled 2023-09-06: qty 10

## 2023-09-06 MED ORDER — LACTATED RINGERS IV SOLN: INTRAVENOUS | Status: DC

## 2023-09-06 MED ORDER — EPHEDRINE SULFATE-NACL 50-0.9 MG/10ML-% IV SOSY
PREFILLED_SYRINGE | INTRAVENOUS | Status: DC | PRN
  Administered 2023-09-06: 5 mg via INTRAVENOUS
  Administered 2023-09-06: 10 mg via INTRAVENOUS

## 2023-09-06 MED ORDER — EPHEDRINE 5 MG/ML INJ
INTRAVENOUS | Status: AC
  Filled 2023-09-06: qty 5

## 2023-09-06 MED ORDER — PHENYLEPHRINE 80 MCG/ML (10ML) SYRINGE FOR IV PUSH (FOR BLOOD PRESSURE SUPPORT)
PREFILLED_SYRINGE | INTRAVENOUS | Status: DC | PRN
  Administered 2023-09-06 (×3): 80 ug via INTRAVENOUS

## 2023-09-06 MED ORDER — LIDOCAINE 2% (20 MG/ML) 5 ML SYRINGE
INTRAMUSCULAR | Status: AC
  Filled 2023-09-06: qty 10

## 2023-09-06 MED ORDER — PROPOFOL 10 MG/ML IV BOLUS
INTRAVENOUS | Status: AC
  Filled 2023-09-06: qty 20

## 2023-09-06 MED ORDER — SODIUM CHLORIDE 0.9 % IV SOLN
2.0000 g | INTRAVENOUS | Status: AC
  Administered 2023-09-06: 2 g via INTRAVENOUS
  Filled 2023-09-06 (×2): qty 2

## 2023-09-06 MED ORDER — DEXAMETHASONE SODIUM PHOSPHATE 10 MG/ML IJ SOLN
INTRAMUSCULAR | Status: DC | PRN
  Administered 2023-09-06: 5 mg via INTRAVENOUS

## 2023-09-06 MED ORDER — FENTANYL CITRATE (PF) 250 MCG/5ML IJ SOLN
INTRAMUSCULAR | Status: AC
  Filled 2023-09-06: qty 5

## 2023-09-06 MED ORDER — LIDOCAINE 2% (20 MG/ML) 5 ML SYRINGE
INTRAMUSCULAR | Status: DC | PRN
Start: 2023-09-06 — End: 2023-09-06
  Administered 2023-09-06: 60 mg via INTRAVENOUS

## 2023-09-06 MED ORDER — PHENYLEPHRINE 80 MCG/ML (10ML) SYRINGE FOR IV PUSH (FOR BLOOD PRESSURE SUPPORT)
PREFILLED_SYRINGE | INTRAVENOUS | Status: AC
  Filled 2023-09-06: qty 10

## 2023-09-06 MED ORDER — OXYCODONE HCL 5 MG PO TABS: 5.0000 mg | ORAL_TABLET | Freq: Once | ORAL | Status: DC | PRN

## 2023-09-06 MED ORDER — DEXAMETHASONE SODIUM PHOSPHATE 10 MG/ML IJ SOLN
INTRAMUSCULAR | Status: AC
  Filled 2023-09-06: qty 1

## 2023-09-06 MED ORDER — OXYCODONE HCL 5 MG/5ML PO SOLN: 5.0000 mg | Freq: Once | ORAL | Status: DC | PRN

## 2023-09-06 MED ORDER — ROCURONIUM BROMIDE 10 MG/ML (PF) SYRINGE
PREFILLED_SYRINGE | INTRAVENOUS | Status: DC | PRN
Start: 2023-09-06 — End: 2023-09-06
  Administered 2023-09-06: 30 mg via INTRAVENOUS
  Administered 2023-09-06: 10 mg via INTRAVENOUS

## 2023-09-06 MED ORDER — SODIUM CHLORIDE 0.9 % IR SOLN
Status: DC | PRN
  Administered 2023-09-06: 3000 mL

## 2023-09-06 MED ORDER — BUPIVACAINE HCL (PF) 0.5 % IJ SOLN
INTRAMUSCULAR | Status: DC | PRN
  Administered 2023-09-06: 30 mL

## 2023-09-06 MED ORDER — SODIUM CHLORIDE 0.9 % IV SOLN: 12.5000 mg | INTRAVENOUS | Status: DC | PRN

## 2023-09-06 MED ORDER — SEVOFLURANE IN SOLN
RESPIRATORY_TRACT | Status: AC
  Filled 2023-09-06: qty 250

## 2023-09-06 MED ORDER — INDOCYANINE GREEN 25 MG IV SOLR
INTRAVENOUS | Status: AC
  Filled 2023-09-06: qty 10

## 2023-09-06 MED ORDER — DOCUSATE SODIUM 100 MG PO CAPS
100.0000 mg | ORAL_CAPSULE | Freq: Two times a day (BID) | ORAL | Status: DC
  Administered 2023-09-06 – 2023-09-07 (×2): 100 mg via ORAL
  Filled 2023-09-06 (×2): qty 1

## 2023-09-06 MED ORDER — SUCCINYLCHOLINE CHLORIDE 200 MG/10ML IV SOSY
PREFILLED_SYRINGE | INTRAVENOUS | Status: AC
  Filled 2023-09-06: qty 10

## 2023-09-06 MED ORDER — CHLORHEXIDINE GLUCONATE 0.12 % MT SOLN
15.0000 mL | Freq: Once | OROMUCOSAL | Status: AC
  Administered 2023-09-06: 15 mL via OROMUCOSAL

## 2023-09-06 MED ORDER — HYDROMORPHONE HCL 1 MG/ML IJ SOLN: 0.2500 mg | INTRAMUSCULAR | Status: DC | PRN

## 2023-09-06 MED ORDER — BUPIVACAINE HCL (PF) 0.5 % IJ SOLN
INTRAMUSCULAR | Status: AC
  Filled 2023-09-06: qty 30

## 2023-09-06 MED ORDER — ONDANSETRON HCL 4 MG/2ML IJ SOLN
INTRAMUSCULAR | Status: AC
  Filled 2023-09-06: qty 4

## 2023-09-06 MED ORDER — CHLORHEXIDINE GLUCONATE CLOTH 2 % EX PADS
6.0000 | MEDICATED_PAD | Freq: Once | CUTANEOUS | Status: AC
  Administered 2023-09-06: 6 via TOPICAL

## 2023-09-06 MED ORDER — ROCURONIUM BROMIDE 10 MG/ML (PF) SYRINGE
PREFILLED_SYRINGE | INTRAVENOUS | Status: AC
  Filled 2023-09-06: qty 20

## 2023-09-06 MED ORDER — SUCCINYLCHOLINE CHLORIDE 200 MG/10ML IV SOSY
PREFILLED_SYRINGE | INTRAVENOUS | Status: DC | PRN
  Administered 2023-09-06: 100 mg via INTRAVENOUS

## 2023-09-06 MED ORDER — FENTANYL CITRATE (PF) 100 MCG/2ML IJ SOLN
INTRAMUSCULAR | Status: AC
  Filled 2023-09-06: qty 2

## 2023-09-06 MED ORDER — ONDANSETRON HCL 4 MG/2ML IJ SOLN
INTRAMUSCULAR | Status: DC | PRN
  Administered 2023-09-06: 4 mg via INTRAVENOUS

## 2023-09-06 MED ORDER — GADOBUTROL 1 MMOL/ML IV SOLN
6.0000 mL | Freq: Once | INTRAVENOUS | Status: AC | PRN
  Administered 2023-09-06: 6 mL via INTRAVENOUS

## 2023-09-06 MED ORDER — MORPHINE SULFATE (PF) 2 MG/ML IV SOLN
2.0000 mg | INTRAVENOUS | Status: DC | PRN
  Administered 2023-09-08 – 2023-09-09 (×4): 2 mg via INTRAVENOUS
  Filled 2023-09-06 (×5): qty 1

## 2023-09-06 MED ORDER — FENTANYL CITRATE (PF) 250 MCG/5ML IJ SOLN
INTRAMUSCULAR | Status: DC | PRN
  Administered 2023-09-06 (×5): 50 ug via INTRAVENOUS

## 2023-09-06 MED ORDER — ORAL CARE MOUTH RINSE: 15.0000 mL | Freq: Once | OROMUCOSAL | Status: AC

## 2023-09-06 MED ORDER — SUGAMMADEX SODIUM 200 MG/2ML IV SOLN
INTRAVENOUS | Status: DC | PRN
  Administered 2023-09-06: 200 mg via INTRAVENOUS

## 2023-09-06 MED ORDER — PHENYLEPHRINE 80 MCG/ML (10ML) SYRINGE FOR IV PUSH (FOR BLOOD PRESSURE SUPPORT)
PREFILLED_SYRINGE | INTRAVENOUS | Status: AC
Start: 2023-09-06 — End: ?
  Filled 2023-09-06: qty 10

## 2023-09-06 MED ORDER — PROPOFOL 10 MG/ML IV BOLUS
INTRAVENOUS | Status: DC | PRN
  Administered 2023-09-06: 100 mg via INTRAVENOUS

## 2023-09-06 MED ORDER — STERILE WATER FOR IRRIGATION IR SOLN
Status: DC | PRN
Start: 2023-09-06 — End: 2023-09-06
  Administered 2023-09-06: 500 mL

## 2023-09-06 MED ORDER — HEMOSTATIC AGENTS (NO CHARGE) OPTIME
TOPICAL | Status: DC | PRN
  Administered 2023-09-06 (×2): 1 via TOPICAL

## 2023-09-06 SURGICAL SUPPLY — 45 items
BLADE SURG 15 STRL LF DISP TIS (BLADE) ×3 IMPLANT
CAUTERY HOOK MNPLR 1.6 DVNC XI (INSTRUMENTS) ×3 IMPLANT
CHLORAPREP W/TINT 26 (MISCELLANEOUS) ×3 IMPLANT
CLIP LIGATING HEM O LOK PURPLE (MISCELLANEOUS) ×3 IMPLANT
COVER LIGHT HANDLE STERIS (MISCELLANEOUS) ×6 IMPLANT
DRAPE ARM DVNC X/XI (DISPOSABLE) ×12 IMPLANT
DRAPE COLUMN DVNC XI (DISPOSABLE) ×3 IMPLANT
DRSG TEGADERM 2-3/8X2-3/4 SM (GAUZE/BANDAGES/DRESSINGS) IMPLANT
ELECTRODE REM PT RTRN 9FT ADLT (ELECTROSURGICAL) ×3 IMPLANT
EVACUATOR DRAINAGE 10X20 100CC (DRAIN) IMPLANT
FORCEPS BPLR R/ABLATION 8 DVNC (INSTRUMENTS) ×3 IMPLANT
FORCEPS PROGRASP DVNC XI (FORCEP) ×3 IMPLANT
GAUZE SPONGE 2X2 STRL 8-PLY (GAUZE/BANDAGES/DRESSINGS) IMPLANT
GLOVE BIO SURGEON STRL SZ 6.5 (GLOVE) ×6 IMPLANT
GLOVE BIOGEL PI IND STRL 6.5 (GLOVE) ×6 IMPLANT
GLOVE BIOGEL PI IND STRL 7.0 (GLOVE) ×6 IMPLANT
GOWN STRL REUS W/TWL LRG LVL3 (GOWN DISPOSABLE) ×9 IMPLANT
GRASPER SUT TROCAR 14GX15 (MISCELLANEOUS) ×3 IMPLANT
HEMOSTAT SNOW SURGICEL 2X4 (HEMOSTASIS) IMPLANT
IRRIGATOR SUCT 8 DISP DVNC XI (IRRIGATION / IRRIGATOR) IMPLANT
KIT TURNOVER KIT A (KITS) ×3 IMPLANT
MANIFOLD NEPTUNE II (INSTRUMENTS) IMPLANT
NDL HYPO 21X1.5 SAFETY (NEEDLE) ×3 IMPLANT
NDL INSUFFLATION 14GA 120MM (NEEDLE) ×3 IMPLANT
NEEDLE HYPO 21X1.5 SAFETY (NEEDLE) ×2 IMPLANT
NEEDLE INSUFFLATION 14GA 120MM (NEEDLE) ×2 IMPLANT
OBTURATOR OPTICALSTD 8 DVNC (TROCAR) ×3 IMPLANT
PACK LAP CHOLE LZT030E (CUSTOM PROCEDURE TRAY) ×3 IMPLANT
PAD ARMBOARD POSITIONER FOAM (MISCELLANEOUS) ×3 IMPLANT
PENCIL HANDSWITCHING (ELECTRODE) ×3 IMPLANT
POSITIONER HEAD 8X9X4 ADT (SOFTGOODS) ×3 IMPLANT
POWDER SURGICEL 3.0 GRAM (HEMOSTASIS) IMPLANT
SEAL UNIV 5-12 XI (MISCELLANEOUS) ×12 IMPLANT
SET BASIN LINEN APH (SET/KITS/TRAYS/PACK) ×3 IMPLANT
SET TUBE SMOKE EVAC HIGH FLOW (TUBING) ×3 IMPLANT
SOL .9 NS 3000ML IRR UROMATIC (IV SOLUTION) ×3 IMPLANT
SPONGE DRAIN TRACH 4X4 STRL 2S (GAUZE/BANDAGES/DRESSINGS) IMPLANT
STAPLER VISISTAT (STAPLE) IMPLANT
SUT ETHILON 3 0 PS 1 18 (SUTURE) IMPLANT
SUT MNCRL AB 4-0 PS2 18 (SUTURE) ×6 IMPLANT
SUT VICRYL 0 UR6 27IN ABS (SUTURE) ×3 IMPLANT
SYR 30ML LL (SYRINGE) ×3 IMPLANT
SYSTEM RETRIEVL 5MM INZII UNIV (BASKET) ×3 IMPLANT
TIP ENDOSCOPIC SURGICEL (TIP) IMPLANT
WATER STERILE IRR 500ML POUR (IV SOLUTION) ×3 IMPLANT

## 2023-09-06 NOTE — Progress Notes (Signed)
 [facility]  JP site was bleeding in PACU. RN reinforced it. Saw patient in room and no longer with bloody drainage on dressing. JP with dark SS output.   Deena Farrier, MD [facility] [address] [place] Barlow [place], [place] [address] [phone] (office)

## 2023-09-06 NOTE — Plan of Care (Signed)
  Problem: Health Behavior/Discharge Planning: Goal: Ability to manage health-related needs will improve Outcome: Progressing   Problem: Clinical Measurements: Goal: Ability to maintain clinical measurements within normal limits will improve Outcome: Progressing Goal: Will remain free from infection Outcome: Progressing   Problem: Clinical Measurements: Goal: Will remain free from infection Outcome: Progressing

## 2023-09-06 NOTE — Op Note (Signed)
 [facility] Operative Note  09/06/23  Preoperative Diagnosis: Acute cholecystitis, possible diverticula versus contained perforation    Postoperative Diagnosis: Acute gangrenous cholecystitis with perforation (mid lower gallbladder), posterior medial diverticula of the gallbladder    Procedure(s) Performed: Robotic Assisted Laparoscopic Cholecystectomy   Surgeon: Heidi Llamas C. Collene Dawson, MD   Assistants: No qualified resident was available    Anesthesia: General endotracheal   Anesthesiologist: Araceli Knight, MD    Specimens: Gallbladder   Estimated Blood Loss: Minimal   Blood Replacement: None    Complications: None    Wound Class: Dirty infected    Operative Indications: The patient was found to have concern for cholecystitis on imaging and concern for dilated CBD.  MRCP did not show any CBD stones but did have concern for diverticula of the gallbladder versus a contained perforation.  We discussed the risk of the procedure including but not limited to bleeding, infection, injury to the common bile duct, bile leak, need for further procedures, chance of subtotal cholecystectomy versus drainage procedure and abortion of the cholecystectomy.    Findings:  Upon entering the abdomen (organ space), I encountered a phlegmon involving the gallbladder with gangrenous changes to the wall and three small areas of perforation in the mid lower gallbladder . Critical view of safety noted All clips intact at the end of the case Adequate hemostasis    Procedure: Firefly was given in the preoperative area. The patient was taken to the operating room and placed supine. General endotracheal anesthesia was induced. Intravenous antibiotics were administered per protocol.  An orogastric tube positioned to decompress the stomach. The abdomen was prepared and draped in the usual sterile fashion.   Veress needle was placed at the supraumbilical area and insufflation was started after  confirming a positive saline drop test and no immediate increase in abdominal pressure.  After reaching 15 mm, the Veress needle was removed and a 8 mm port was placed via optiview technique supraumbilical, measuring 20 mm away from the suspected position of the gallbladder.  The abdomen was inspected and no abnormalities or injuries were found.  Under direct vision, ports were placed in the following locations in a semi curvilinear position around the target of the gallbladder: Two 8 mm ports on the patient's right each having 8cm clearance to the adjacent ports and one 8 mm port placed on the patient's left 8 cm from the umbilical port. Once ports were placed, the table was placed in the reverse Trendelenburg position with the right side up. The robot platform was brought into the operative field and docked to the ports successfully.  An endoscope was placed through the umbilical port, prograsper through the most lateral right port, forced bipolar to the port just right of the umbilicus, and then a hook cautery in the left port. Suction irrigation was also available.   The gallbladder was distended and omentum was adherent. I gently swept the omentum down with suction irrigation.   The dome of the gallbladder was grasped with prograsp and retracted over the dome of the liver. I identified the areas of concern with gangrenous changes in he mid lower gallbladder wall where there appeared to be perforation. Upon entering the abdomen (organ space), I encountered a phlegmon involving the gallbladder with gangrenous changes to the wall and three small areas of perforation in the mid lower gallbladder. I could get below the area of perforation and the gallbladder more inferiorly looked more intact. Adhesions between the gallbladder and omentum, duodenum and transverse  colon were lysed via hook cautery. The infundibulum was grasped with the forced bipolar and retracted toward the right lower quadrant but gently with  care as to not disrupt the gallbladder further.  This maneuver exposed Calot's triangle. Carefully using mostly blunt dissection with the suction irrigator, I found the cystic duct as it exited the infundibulum.   Firefly was used throughout the dissection to ensure safe visualization of the cystic duct. This duct never lit up, and was about 2mm in size. I cleared out under and behind it and noted a large rind and what appeared to be a diverticula in the posterior medial aspect of the gallbladder. As this was abnormal, I opted to clear the gallbladder and dome down some to allow for better mobilization.  I domed down and then was able to clear the gallbladder lateral to medial on the posterior side, using some hook cautery high and laterally, and then more blunt suction dissection in the posterior medial aspect.  At this point, I had the cystic duct cleared and noted the common bile duct inferiorly with Firefly activation. This diverticula was very adherent and attached to the inflammatory rind on this posterior medial aspect.  I worked with great care using blunt dissection to carefully get into the plain of the gallbladder and liver and worked to pull the rind from the diverticula. In doing this the inferior aspect of the gallbladder was torn and some bile spilled out but was managed with suction.  At this point, I had the gallbladder off the gallbladder fossa, and the cystic duct coming into the infundibulum and then this diverticula adherent to the rind /possible cystic artery.  I was afraid I would tear this and I opted to clip the cystic duct as I had the Critical view of safety with the liver bed clearly visible behind the duct and artery with no additional structures noted.  The cystic duct was double clipped, and I continued to tissue off the gallbladder diverticula from the rind and what ultimately was a lymph node. A small artery < 1mm was identified and clipped once. This freed the gallbladder from the  fossa.   Hemostasis was checked prior to removing the hook cautery. Suction and irrigation was performed.   A 5mm Endo Catch bag was then placed through the left side port. The robot was undocked and moved out of the field,  and the gallbladder was removed in the bag.  The gallbladder was passed off the table as a specimen.  The left port site closed with a 0 vicryl and PMI due to dilation from removing the gallbladder. There was no evidence of bleeding from the gallbladder fossa or cystic artery or leakage of the bile from the cystic duct stump. I suctioned and irrigated more. There was an adhesion of omentum to the anterior abdominal wall that I did not see initially and I took this down with cautery at the bedside with a laparoscopic hook cautery.  Surgical Snow and Powder were placed into the gallbladder fossa. I pulled a JP drain through the right lateral most port site and placed it into the fossa. This was secured with a 3-0 Nylon suture.  The abdomen was desufflated and secondary trocars were removed under direct vision.   No bleeding was noted. The umbilical site was noted to have the fascia just under the skin, so I closed with a 0 Vicryl suture. Marcaine  was injected. All skin incisions were closed with staples and dressings.  I opened the gallbladder up and the posterior medial diverticula was packed full of stones and the front of the gallbladder was disrupted. The mucosa appeared necrotic.   Final inspection revealed acceptable hemostasis. All counts were correct at the end of the case. The patient was awakened from anesthesia and extubated without complication. The OG tube was removed.  The patient went to the PACU in stable condition.   Deena Farrier, MD [facility] [address] Barlow [place], [place] [address] [phone] (office)

## 2023-09-06 NOTE — Progress Notes (Signed)
 [facility]  Updated family. Gangrenous cholecystis with perforation was able to get critical view and take out gallbladder.   JP drain in place. Antibiotics for 5 days post op LFTs, CBC  Telemetry overnight  Deena Farrier, MD [facility] [address] [place], [place] [address] [phone] (office)

## 2023-09-06 NOTE — Progress Notes (Signed)
 PROGRESS NOTE    Antonio Hughes  ZOX:[id number] DOB: 07/10/1945 DOA: 09/05/2023 PCP: Wyvonna Heidelberg, MD   Brief Narrative:    Antonio Hughes is a 78 y.o. male with medical history significant for hypertension, prostate cancer.  Patient presented to the ED with complaints of generalized weakness, poor oral intake, right upper quadrant pain over the past 4 days.  Patient admitted with sepsis secondary to acute cholecystitis with noted CBD dilation and MRCP does not show any signs of choledocholithiasis.  General surgery consulted for further evaluation with plans for lap chole today.  Assessment & Plan:   Principal Problem:   Acute cholecystitis Active Problems:   Sepsis (HCC)   HTN (hypertension)   Prostate cancer (HCC)   Dilated cbd, acquired  Assessment and Plan:   Acute cholecystitis Meeting sepsis criteria with fever of 100.1, leukocytosis of 20.1.  T. bili elevated at 1.9 otherwise normal liver enzymes.  Normal lipase 28.  CT abdomen and pelvis with contrast- Pericholecystic fluid and the CBD dilatation. No intrahepatic biliary ductal dilatation. No hepatic parenchymal abnormalities.  Ultrasound evaluation recommended. - Plan for laparoscopic cholecystectomy today versus drain placement -MRCP with no choledocholithiasis   Sepsis (HCC) Sepsis criteria with leukocytosis of 20.1, fever of 100.1.  Likely secondary to acute cholecystitis, UTI also possible.  With UA showing nitrites and leukocytes and many bacteria. -Lactic acid -Obtain blood cultures -Continue Zosyn  -Add-on urine cultures -Hydrate   Prostate cancer [facility]) Status post prostatectomy.   HTN (hypertension) Stable. -Hold chlorthalidone  to allow for hydration -Resume diltiazem    DVT prophylaxis: SCDs Code Status: Full Family Communication: None at bedside Disposition Plan:  Status is: Inpatient Remains inpatient appropriate because: Need for IV medications.  Consultants:  General  Surgery  Procedures:  None  Antimicrobials:  Anti-infectives (From admission, onward)    Start     Dose/Rate Route Frequency Ordered Stop   09/07/23 0600  cefoTEtan  (CEFOTAN ) 2 g in sodium chloride  0.9 % 100 mL IVPB        2 g 200 mL/hr over 30 Minutes Intravenous On call to O.R. 09/06/23 1341 09/08/23 0559   09/06/23 0200  piperacillin -tazobactam (ZOSYN ) IVPB 3.375 g        3.375 g 12.5 mL/hr over 240 Minutes Intravenous Every 8 hours 09/05/23 2224     09/05/23 1945  piperacillin -tazobactam (ZOSYN ) IVPB 3.375 g  Status:  Discontinued        3.375 g 100 mL/hr over 30 Minutes Intravenous Every 6 hours 09/05/23 1939 09/05/23 2223      Subjective: Patient seen and evaluated today with no new acute complaints or concerns. No acute concerns or events noted overnight.  Continues to have right upper quadrant abdominal pain, but denies any nausea or vomiting.  Objective: Vitals:   09/06/23 0340 09/06/23 0739 09/06/23 0858 09/06/23 1114  BP: 129/73 133/77 (!) 121/93 127/71  Pulse: 84 81 75 78  Resp: (!) 22 14  18   Temp: 98.9 F (37.2 C) 99.4 F (37.4 C)  99.2 F (37.3 C)  TempSrc: Oral Oral  Oral  SpO2: 96% 96%  95%  Weight:      Height:       No intake or output data in the 24 hours ending 09/06/23 1345 Filed Weights   09/05/23 1619 09/05/23 2304  Weight: 65.8 kg 59.8 kg    Examination:  General exam: Appears calm and comfortable  Respiratory system: Clear to auscultation. Respiratory effort normal. Cardiovascular system: S1 & S2 heard, RRR.  Gastrointestinal system: Abdomen is soft Central nervous system: Alert and awake Extremities: No edema Skin: No significant lesions noted Psychiatry: Flat affect.    Data Reviewed: I have personally reviewed following labs and imaging studies  CBC: Recent Labs  Lab 09/05/23 1646 09/06/23 0502  WBC 20.1* 20.7*  NEUTROABS 17.4*  --   HGB 14.5 13.4  HCT 41.0 37.6*  MCV 79.2* 79.0*  PLT 239 207   Basic Metabolic  Panel: Recent Labs  Lab 09/05/23 1646 09/06/23 0502  NA 135 135  K 3.9 3.6  CL 98 100  CO2 26 25  GLUCOSE 179* 173*  BUN 31* 30*  CREATININE 1.17 1.10  CALCIUM 10.0 9.6   GFR: Estimated Creatinine Clearance: 47.6 mL/min (by C-G formula based on SCr of 1.1 mg/dL). Liver Function Tests: Recent Labs  Lab 09/05/23 1646 09/06/23 0502  AST 39 33  ALT 46* 44  ALKPHOS 74 78  BILITOT 1.9* 1.9*  PROT 8.0 7.4  ALBUMIN 3.7 3.2*   Recent Labs  Lab 09/05/23 1646  LIPASE 28   No results for input(s): "AMMONIA" in the last 168 hours. Coagulation Profile: No results for input(s): "INR", "PROTIME" in the last 168 hours. Cardiac Enzymes: No results for input(s): "CKTOTAL", "CKMB", "CKMBINDEX", "TROPONINI" in the last 168 hours. BNP (last 3 results) No results for input(s): "PROBNP" in the last 8760 hours. HbA1C: No results for input(s): "HGBA1C" in the last 72 hours. CBG: Recent Labs  Lab 09/05/23 1615  GLUCAP 199*   Lipid Profile: No results for input(s): "CHOL", "HDL", "LDLCALC", "TRIG", "CHOLHDL", "LDLDIRECT" in the last 72 hours. Thyroid  Function Tests: No results for input(s): "TSH", "T4TOTAL", "FREET4", "T3FREE", "THYROIDAB" in the last 72 hours. Anemia Panel: No results for input(s): "VITAMINB12", "FOLATE", "FERRITIN", "TIBC", "IRON", "RETICCTPCT" in the last 72 hours. Sepsis Labs: Recent Labs  Lab 09/05/23 2240  LATICACIDVEN 1.2    Recent Results (from the past 240 hours)  Culture, blood (Routine X 2) w Reflex to ID Panel     Status: None (Preliminary result)   Collection Time: 09/05/23 10:39 PM   Specimen: BLOOD  Result Value Ref Range Status   Specimen Description BLOOD BLOOD LEFT ARM  Final   Special Requests   Final    BOTTLES DRAWN AEROBIC AND ANAEROBIC Blood Culture adequate volume   Culture   Final    NO GROWTH < 12 HOURS Performed at [facility], [address], [place], [place] [address]    Report Status PENDING  Incomplete  Culture, blood  (Routine X 2) w Reflex to ID Panel     Status: None (Preliminary result)   Collection Time: 09/05/23 10:40 PM   Specimen: BLOOD  Result Value Ref Range Status   Specimen Description BLOOD BLOOD LEFT HAND  Final   Special Requests   Final    BOTTLES DRAWN AEROBIC AND ANAEROBIC Blood Culture adequate volume   Culture   Final    NO GROWTH < 12 HOURS Performed at [facility], [address], [place], [place] [address]    Report Status PENDING  Incomplete         Radiology Studies: MR ABDOMEN MRCP W WO CONTAST Result Date: 09/06/2023 CLINICAL DATA:  Abdominal pain, elevated T. bili, CT shows CBD dilatation and suggest cholecystitis. EXAM: MRI ABDOMEN WITHOUT AND WITH CONTRAST (INCLUDING MRCP) TECHNIQUE: Multiplanar multisequence MR imaging of the abdomen was performed both before and after the administration of intravenous contrast. Heavily T2-weighted images of the biliary and pancreatic ducts were obtained,  and three-dimensional MRCP images were rendered by post processing. CONTRAST:  6mL GADAVIST GADOBUTROL 1 MMOL/ML IV SOLN COMPARISON:  CT scan abdomen and pelvis from 09/05/2023. FINDINGS: Lower chest: Unremarkable MR appearance to the lung bases. No pleural effusion. No pericardial effusion. Normal heart size. Hepatobiliary: The liver is normal in size and configuration. There is a single subcapsular 5 mm cyst noted in the right hepatic lobe, segment 7. No intra or extrahepatic bile duct dilation. No choledocholithiasis. The gallbladder is distended. There is diffuse moderate to marked gallbladder wall swelling/edema and pericholecystic fat stranding/free fluid. There are several areas of outpouching from the gallbladder lumen (series 16, images 44 and 48), which are nonspecific but concerning for contained perforation versus gallbladder diverticula. There are moderate volume gallstones including a gallstone in the neck. Pancreas: No mass, inflammatory changes or other parenchymal abnormality  identified. No main pancreatic duct dilation. Spleen:  Within normal limits in size and appearance. No focal mass. Adrenals/Urinary Tract: Unremarkable adrenal glands. No hydroureteronephrosis. No suspicious renal mass. There are at least 2 simple cysts noted in the left kidney. Stomach/Bowel: Visualized portions within the abdomen are unremarkable. No disproportionate dilation of bowel loops. Vascular/Lymphatic: No pathologically enlarged lymph nodes identified. No abdominal aortic aneurysm demonstrated. No ascites. Other: There is trace amount of ascites in the right paracolic gutter. Musculoskeletal: No suspicious bone lesions identified. IMPRESSION: 1. Findings favor acute cholecystitis with several areas of outpouching from the gallbladder lumen, which are nonspecific but concerning for contained perforation versus gallbladder diverticula. 2. No choledocholithiasis or bile duct dilation. 3. Multiple other nonacute observations, as described above. Electronically Signed   By: Beula Brunswick M.D.   On: 09/06/2023 13:24   MR 3D Recon At Scanner Result Date: 09/06/2023 CLINICAL DATA:  Abdominal pain, elevated T. bili, CT shows CBD dilatation and suggest cholecystitis. EXAM: MRI ABDOMEN WITHOUT AND WITH CONTRAST (INCLUDING MRCP) TECHNIQUE: Multiplanar multisequence MR imaging of the abdomen was performed both before and after the administration of intravenous contrast. Heavily T2-weighted images of the biliary and pancreatic ducts were obtained, and three-dimensional MRCP images were rendered by post processing. CONTRAST:  6mL GADAVIST GADOBUTROL 1 MMOL/ML IV SOLN COMPARISON:  CT scan abdomen and pelvis from 09/05/2023. FINDINGS: Lower chest: Unremarkable MR appearance to the lung bases. No pleural effusion. No pericardial effusion. Normal heart size. Hepatobiliary: The liver is normal in size and configuration. There is a single subcapsular 5 mm cyst noted in the right hepatic lobe, segment 7. No intra or  extrahepatic bile duct dilation. No choledocholithiasis. The gallbladder is distended. There is diffuse moderate to marked gallbladder wall swelling/edema and pericholecystic fat stranding/free fluid. There are several areas of outpouching from the gallbladder lumen (series 16, images 44 and 48), which are nonspecific but concerning for contained perforation versus gallbladder diverticula. There are moderate volume gallstones including a gallstone in the neck. Pancreas: No mass, inflammatory changes or other parenchymal abnormality identified. No main pancreatic duct dilation. Spleen:  Within normal limits in size and appearance. No focal mass. Adrenals/Urinary Tract: Unremarkable adrenal glands. No hydroureteronephrosis. No suspicious renal mass. There are at least 2 simple cysts noted in the left kidney. Stomach/Bowel: Visualized portions within the abdomen are unremarkable. No disproportionate dilation of bowel loops. Vascular/Lymphatic: No pathologically enlarged lymph nodes identified. No abdominal aortic aneurysm demonstrated. No ascites. Other: There is trace amount of ascites in the right paracolic gutter. Musculoskeletal: No suspicious bone lesions identified. IMPRESSION: 1. Findings favor acute cholecystitis with several areas of outpouching from the gallbladder lumen,  which are nonspecific but concerning for contained perforation versus gallbladder diverticula. 2. No choledocholithiasis or bile duct dilation. 3. Multiple other nonacute observations, as described above. Electronically Signed   By: Beula Brunswick M.D.   On: 09/06/2023 13:24   CT ABDOMEN PELVIS W CONTRAST Result Date: 09/05/2023 CLINICAL DATA:  Anorexia and weakness. EXAM: CT ABDOMEN AND PELVIS WITH CONTRAST TECHNIQUE: Multidetector CT imaging of the abdomen and pelvis was performed using the standard protocol following bolus administration of intravenous contrast. RADIATION DOSE REDUCTION: This exam was performed according to the  departmental dose-optimization program which includes automated exposure control, adjustment of the mA and/or kV according to patient size and/or use of iterative reconstruction technique. CONTRAST:  OMNIPAQUE  IOHEXOL  300 MG/ML  SOLN COMPARISON:  09/01/2009. FINDINGS: Lower chest: No acute abnormality. No pleural or pericardial effusions. Hepatobiliary: Pericholecystic fluid and the CBD dilatation. No intrahepatic biliary ductal dilatation. No hepatic parenchymal abnormalities. Pancreas: Unremarkable. No pancreatic ductal dilatation or surrounding inflammatory changes. Spleen: Normal in size without focal abnormality. Adrenals/Urinary Tract: No adrenal lesions. There are couple of mm sized hypodensities in left kidney consistent with cyst. No nephrolithiasis or hydronephrosis. Unremarkable urinary bladder. Stomach/Bowel: Stomach is within normal limits. Appendix appears normal. No evidence of bowel wall thickening, distention, or inflammatory changes. Vascular/Lymphatic: Aortic atherosclerosis. No enlarged abdominal or pelvic lymph nodes. Reproductive: Postop prostatectomy. Other: No abdominal wall hernia or abnormality. No abdominopelvic ascites. Musculoskeletal: Thoracolumbosacral degenerative changes. IMPRESSION: 1. Pericholecystic fluid and CBD dilatation. Recommend ultrasound evaluation. 2. Left renal milimeter-sized cysts. 3. Aortic atherosclerosis (ICD10-I70.0). Electronically Signed   By: Sydell Eva M.D.   On: 09/05/2023 19:22   DG Abdomen 1 View Result Date: 09/05/2023 CLINICAL DATA:  Abdominal pain and constipation EXAM: ABDOMEN - 1 VIEW COMPARISON:  CT pelvis 09/01/2009 FINDINGS: Formed stool in the rectal vault with gas in the sigmoid colon, descending colon, distal transverse colon. Paucity of bowel gas in the right abdomen. Clustered loops of small bowel in the central to left abdomen measure up to 3 cm in diameter, borderline dilated. Levoconvex lumbar scoliosis with rotary component.  Lumbar spondylosis with loss of intervertebral disc height in the upper lumbar spine. Suspected spurring along the sacroiliac joints. IMPRESSION: 1. Formed stool in the rectal vault with gas in the sigmoid colon, descending colon, and distal transverse colon. 2. Clustered loops of small bowel in the central to left abdomen measure up to 3 cm in diameter, borderline dilated. This is nonspecific and could be due to ileus or partial small bowel obstruction. 3. Levoconvex lumbar scoliosis with rotary component. Lumbar spondylosis. Electronically Signed   By: Freida Jes M.D.   On: 09/05/2023 17:19        Scheduled Meds:  Chlorhexidine Gluconate Cloth  6 each Topical Once   diltiazem   360 mg Oral Daily   indocyanine green   2.5 mg Intravenous Once   LORazepam   0.5 mg Intravenous Once   Continuous Infusions:  [START ON 09/07/2023] cefoTEtan  (CEFOTAN ) IV     piperacillin -tazobactam (ZOSYN )  IV 3.375 g (09/06/23 1014)     LOS: 1 day    Time spent: 55 minutes    Vidhi Delellis D Mason Sole, DO [facility]  If 7PM-7AM, please contact night-coverage [email or web address] 09/06/2023, 1:45 PM

## 2023-09-06 NOTE — Progress Notes (Signed)
 [facility]  Called and discussed case with patient. Discussed MRCP results. Discussed potential of subtotal cholecystectomy versus drain if there is truly a perforation versus just a diverticula. Discussed option of IR drain for 6 weeks and cholecystectomy after that. Discussed that IR drain would likely be placed over weekend but not today.  He wants to proceed with surgery today. Discussed case with Dr. Larrie Po and he agrees, lets take a look and see as this could just be a diverticula or fold of the gallbladder. I can always drain area/ abort if needed.   Deena Farrier, MD [facility] [address] [place], [place] [address] [phone] (office)

## 2023-09-06 NOTE — Transfer of Care (Signed)
 Immediate Anesthesia Transfer of Care Note  Patient: Antonio Hughes  Procedure(s) Performed: CHOLECYSTECTOMY, ROBOT-ASSISTED, LAPAROSCOPIC (Abdomen) LYSIS, ADHESIONS, LAPAROSCOPIC (Abdomen)  Patient Location: PACU  Anesthesia Type:General  Level of Consciousness: awake  Airway & Oxygen Therapy: Patient Spontanous Breathing  Post-op Assessment: Post -op Vital signs reviewed and stable  Post vital signs: Reviewed  Last Vitals:  Vitals Value Taken Time  BP 105/56 09/06/23 1749  Temp 36.9 C 09/06/23 1748  Pulse 58 09/06/23 1753  Resp 21 09/06/23 1753  SpO2 95 % 09/06/23 1753  Vitals shown include unfiled device data.  Last Pain:  Vitals:   09/06/23 1417  TempSrc: Oral  PainSc: 0-No pain         Complications: There were no known notable events for this encounter.

## 2023-09-06 NOTE — Anesthesia Procedure Notes (Signed)
 Procedure Name: Intubation Date/Time: 09/06/2023 2:49 PM  Performed by: Alex Hylan, CRNAPre-anesthesia Checklist: Patient identified, Emergency Drugs available, Suction available and Patient being monitored Patient Re-evaluated:Patient Re-evaluated prior to induction Oxygen Delivery Method: Circle system utilized Preoxygenation: Pre-oxygenation with 100% oxygen Induction Type: IV induction Ventilation: Mask ventilation without difficulty Laryngoscope Size: Mac and 4 Grade View: Grade II Tube type: Oral Tube size: 7.5 mm Number of attempts: 1 Airway Equipment and Method: Stylet Placement Confirmation: ETT inserted through vocal cords under direct vision, positive ETCO2 and breath sounds checked- equal and bilateral Secured at: 21 cm Tube secured with: Tape Dental Injury: Teeth and Oropharynx as per pre-operative assessment

## 2023-09-06 NOTE — Consult Note (Signed)
 [facility] Consult  Reason for Consult: Acute cholecystitis, CBD dilation  Referring Physician: Dr. Mason Sole  Chief Complaint   Weakness     HPI: Antonio Hughes is a 78 y.o. male with RUQ pain that came on suddenly and he has never had pain like this prior. He has a history of prostate cancer and prior prostatectomy laparoscopic. He had nausea and vomiting in addition to the pain. He was noted to have concern for cholecystitis as well as a dilated CBD on CT and elevated bilirubin. Dr. Quintella Buck admitted him and ordered MRCP. This has been done this AM but is not read. He has been started on antibiotics.    Past Medical History:  Diagnosis Date   H/O inguinal hernia    s/p repair   H/O prostate cancer    s/p surgical intervention   Hypertension     Past Surgical History:  Procedure Laterality Date   COLONOSCOPY N/A 12/15/2015   Procedure: COLONOSCOPY;  Surgeon: Suzette Espy, MD;  Location: [facility];  Service: Endoscopy;  Laterality: N/A;  930   INGUINAL HERNIA REPAIR Right 05/14/2013   Procedure: HERNIA REPAIR INGUINAL ADULT;  Surgeon: Myrl Askew, MD;  Location: [facility] ORS;  Service: General;  Laterality: Right;   PROSTATE SURGERY  2011    Family History  Problem Relation Age of Onset   Esophageal cancer Father    Colon cancer Neg Hx     Social History   Tobacco Use   Smoking status: Former    Current packs/day: 0.00    Average packs/day: 5.0 packs/day for 10.0 years (50.0 ttl pk-yrs)    Types: Cigarettes    Start date: 05/07/1961    Quit date: 05/08/1971    Years since quitting: 52.3    Passive exposure: Past   Smokeless tobacco: Never  Vaping Use   Vaping status: Never Used  Substance Use Topics   Alcohol use: No    Alcohol/week: 0.0 standard drinks of alcohol   Drug use: No    Medications: I have reviewed the patient's current medications. Prior to Admission:  Medications Prior to Admission  Medication Sig Dispense Refill Last  Dose/Taking   aspirin 81 MG tablet Take 81 mg by mouth daily.   09/04/2023 at  9:30 AM   chlorthalidone  (HYGROTON ) 25 MG tablet Take 12.5 mg by mouth every other day.   09/04/2023 Morning   diltiazem  (CARDIZEM  CD) 360 MG 24 hr capsule Take 360 mg by mouth daily.   09/04/2023 at  8:58 AM   docusate sodium  100 MG CAPS Take 100 mg by mouth daily. 10 capsule 0 09/04/2023 Morning   Multiple Vitamins-Minerals (MULTIVITAMIN MEN 50+ PO) Take 1 tablet by mouth daily.   09/04/2023 Morning   potassium chloride  (KLOR-CON  M) 10 MEQ tablet Take 10 mEq by mouth daily.   09/04/2023 Morning   Specialty Vitamins Products (MEMORY COMPLEX BRAIN HEALTH PO) Take 1 tablet by mouth daily. Takes Focus Factor Extra Strength by [employer]   09/04/2023 Morning   losartan (COZAAR) 25 MG tablet Take 25 mg by mouth daily. (Patient not taking: Reported on 09/05/2023)   Not Taking   Scheduled:  diltiazem   360 mg Oral Daily   LORazepam   0.5 mg Intravenous Once   Continuous:  piperacillin -tazobactam (ZOSYN )  IV 3.375 g (09/06/23 1014)   PRN:acetaminophen  **OR** acetaminophen , morphine  injection, ondansetron  **OR** ondansetron  (ZOFRAN ) IV, polyethylene glycol  No Known Allergies   ROS:  A comprehensive review of systems was negative  except for: Gastrointestinal: positive for abdominal pain, nausea, and vomiting  Blood pressure 127/71, pulse 78, temperature 99.2 F (37.3 C), temperature source Oral, resp. rate 18, height 5\' 6"  (1.676 m), weight 59.8 kg, SpO2 95%. Physical Exam Vitals reviewed.  HENT:     Head: Normocephalic.     Nose: Nose normal.  Eyes:     Extraocular Movements: Extraocular movements intact.  Cardiovascular:     Rate and Rhythm: Normal rate.  Pulmonary:     Effort: Pulmonary effort is normal.  Abdominal:     Palpations: Abdomen is soft.     Tenderness: There is abdominal tenderness in the right upper quadrant.  Musculoskeletal:        General: Normal range of motion.  Skin:    General: Skin is  warm.  Neurological:     General: No focal deficit present.     Mental Status: He is alert and oriented to person, place, and time.  Psychiatric:        Mood and Affect: Mood normal.        Behavior: Behavior normal.     Results: Results for orders placed or performed during the hospital encounter of 09/05/23 (from the past 48 hours)  CBG monitoring, ED     Status: Abnormal   Collection Time: 09/05/23  4:15 PM  Result Value Ref Range   Glucose-Capillary 199 (H) 70 - 99 mg/dL    Comment: Glucose reference range applies only to samples taken after fasting for at least 8 hours.  Comprehensive metabolic panel     Status: Abnormal   Collection Time: 09/05/23  4:46 PM  Result Value Ref Range   Sodium 135 135 - 145 mmol/L   Potassium 3.9 3.5 - 5.1 mmol/L   Chloride 98 98 - 111 mmol/L   CO2 26 22 - 32 mmol/L   Glucose, Bld 179 (H) 70 - 99 mg/dL    Comment: Glucose reference range applies only to samples taken after fasting for at least 8 hours.   BUN 31 (H) 8 - 23 mg/dL   Creatinine, Ser 9.14 0.61 - 1.24 mg/dL   Calcium 78.2 8.9 - 95.6 mg/dL   Total Protein 8.0 6.5 - 8.1 g/dL   Albumin 3.7 3.5 - 5.0 g/dL   AST 39 15 - 41 U/L   ALT 46 (H) 0 - 44 U/L   Alkaline Phosphatase 74 38 - 126 U/L   Total Bilirubin 1.9 (H) 0.0 - 1.2 mg/dL   GFR, Estimated >21 >30 mL/min    Comment: (NOTE) Calculated using the CKD-EPI Creatinine Equation (2021)    Anion gap 11 5 - 15    Comment: Performed at [facility], [address] [place], [place] [address]  CBC with Differential     Status: Abnormal   Collection Time: 09/05/23  4:46 PM  Result Value Ref Range   WBC 20.1 (H) 4.0 - 10.5 K/uL   RBC 5.18 4.22 - 5.81 MIL/uL   Hemoglobin 14.5 13.0 - 17.0 g/dL   HCT 46.9 62.9 - 52.8 %   MCV 79.2 (L) 80.0 - 100.0 fL   MCH 28.0 26.0 - 34.0 pg   MCHC 35.4 30.0 - 36.0 g/dL   RDW 41.3 24.4 - 01.0 %   Platelets 239 150 - 400 K/uL   nRBC 0.0 0.0 - 0.2 %   Neutrophils Relative % 87 %   Neutro Abs 17.4 (H)  1.7 - 7.7 K/uL   Lymphocytes Relative 5 %   Lymphs Abs  1.0 0.7 - 4.0 K/uL   Monocytes Relative 7 %   Monocytes Absolute 1.5 (H) 0.1 - 1.0 K/uL   Eosinophils Relative 0 %   Eosinophils Absolute 0.0 0.0 - 0.5 K/uL   Basophils Relative 0 %   Basophils Absolute 0.0 0.0 - 0.1 K/uL   Immature Granulocytes 1 %   Abs Immature Granulocytes 0.25 (H) 0.00 - 0.07 K/uL    Comment: Performed at [facility], [address], [place], [place] [address]  Lipase, blood     Status: None   Collection Time: 09/05/23  4:46 PM  Result Value Ref Range   Lipase 28 11 - 51 U/L    Comment: Performed at [facility], [address] [place], [place] [address]  Urinalysis, Routine w reflex microscopic -Urine, Clean Catch     Status: Abnormal   Collection Time: 09/05/23  7:45 PM  Result Value Ref Range   Color, Urine AMBER (A) YELLOW    Comment: BIOCHEMICALS MAY BE AFFECTED BY COLOR   APPearance HAZY (A) CLEAR   Specific Gravity, Urine >1.046 (H) 1.005 - 1.030   pH 5.0 5.0 - 8.0   Glucose, UA NEGATIVE NEGATIVE mg/dL   Hgb urine dipstick NEGATIVE NEGATIVE   Bilirubin Urine NEGATIVE NEGATIVE   Ketones, ur NEGATIVE NEGATIVE mg/dL   Protein, ur 242 (A) NEGATIVE mg/dL   Nitrite POSITIVE (A) NEGATIVE   Leukocytes,Ua MODERATE (A) NEGATIVE   RBC / HPF 0-5 0 - 5 RBC/hpf   WBC, UA 21-50 0 - 5 WBC/hpf   Bacteria, UA MANY (A) NONE SEEN   Squamous Epithelial / HPF 0-5 0 - 5 /HPF   Mucus PRESENT     Comment: Performed at [facility], [address] [place], [place] [address]  Culture, blood (Routine X 2) w Reflex to ID Panel     Status: None (Preliminary result)   Collection Time: 09/05/23 10:39 PM   Specimen: BLOOD  Result Value Ref Range   Specimen Description BLOOD BLOOD LEFT ARM    Special Requests      BOTTLES DRAWN AEROBIC AND ANAEROBIC Blood Culture adequate volume   Culture      NO GROWTH < 12 HOURS Performed at [facility], [address], [place], [place] [address]    Report Status PENDING   Culture,  blood (Routine X 2) w Reflex to ID Panel     Status: None (Preliminary result)   Collection Time: 09/05/23 10:40 PM   Specimen: BLOOD  Result Value Ref Range   Specimen Description BLOOD BLOOD LEFT HAND    Special Requests      BOTTLES DRAWN AEROBIC AND ANAEROBIC Blood Culture adequate volume   Culture      NO GROWTH < 12 HOURS Performed at [facility], [address], [place], [place] [address]    Report Status PENDING   Lactic acid, plasma     Status: None   Collection Time: 09/05/23 10:40 PM  Result Value Ref Range   Lactic Acid, Venous 1.2 0.5 - 1.9 mmol/L    Comment: Performed at [facility], [address] [place], [place] [address]  Comprehensive metabolic panel     Status: Abnormal   Collection Time: 09/06/23  5:02 AM  Result Value Ref Range   Sodium 135 135 - 145 mmol/L   Potassium 3.6 3.5 - 5.1 mmol/L   Chloride 100 98 - 111 mmol/L   CO2 25 22 - 32 mmol/L   Glucose, Bld 173 (H) 70 - 99 mg/dL  Comment: Glucose reference range applies only to samples taken after fasting for at least 8 hours.   BUN 30 (H) 8 - 23 mg/dL   Creatinine, Ser 1.61 0.61 - 1.24 mg/dL   Calcium 9.6 8.9 - 09.6 mg/dL   Total Protein 7.4 6.5 - 8.1 g/dL   Albumin 3.2 (L) 3.5 - 5.0 g/dL   AST 33 15 - 41 U/L   ALT 44 0 - 44 U/L   Alkaline Phosphatase 78 38 - 126 U/L   Total Bilirubin 1.9 (H) 0.0 - 1.2 mg/dL   GFR, Estimated >04 >54 mL/min    Comment: (NOTE) Calculated using the CKD-EPI Creatinine Equation (2021)    Anion gap 10 5 - 15    Comment: Performed at [facility], [address], [place], [place] [address]  CBC     Status: Abnormal   Collection Time: 09/06/23  5:02 AM  Result Value Ref Range   WBC 20.7 (H) 4.0 - 10.5 K/uL   RBC 4.76 4.22 - 5.81 MIL/uL   Hemoglobin 13.4 13.0 - 17.0 g/dL   HCT 91.4 (L) 78.2 - 95.6 %   MCV 79.0 (L) 80.0 - 100.0 fL   MCH 28.2 26.0 - 34.0 pg   MCHC 35.6 30.0 - 36.0 g/dL   RDW 21.3 08.6 - 57.8 %   Platelets 207 150 - 400 K/uL   nRBC 0.0 0.0 - 0.2 %     Comment: Performed at [facility], [address] [place], [place] [address]   Personally reviewed and showed patient and family- dilated GB with sludge and edema, CBD visible, no obvious stones/ MRCP reviewed- no obvious CBD stone on my interpretation  CT ABDOMEN PELVIS W CONTRAST Result Date: 09/05/2023 CLINICAL DATA:  Anorexia and weakness. EXAM: CT ABDOMEN AND PELVIS WITH CONTRAST TECHNIQUE: Multidetector CT imaging of the abdomen and pelvis was performed using the standard protocol following bolus administration of intravenous contrast. RADIATION DOSE REDUCTION: This exam was performed according to the departmental dose-optimization program which includes automated exposure control, adjustment of the mA and/or kV according to patient size and/or use of iterative reconstruction technique. CONTRAST:  OMNIPAQUE  IOHEXOL  300 MG/ML  SOLN COMPARISON:  09/01/2009. FINDINGS: Lower chest: No acute abnormality. No pleural or pericardial effusions. Hepatobiliary: Pericholecystic fluid and the CBD dilatation. No intrahepatic biliary ductal dilatation. No hepatic parenchymal abnormalities. Pancreas: Unremarkable. No pancreatic ductal dilatation or surrounding inflammatory changes. Spleen: Normal in size without focal abnormality. Adrenals/Urinary Tract: No adrenal lesions. There are couple of mm sized hypodensities in left kidney consistent with cyst. No nephrolithiasis or hydronephrosis. Unremarkable urinary bladder. Stomach/Bowel: Stomach is within normal limits. Appendix appears normal. No evidence of bowel wall thickening, distention, or inflammatory changes. Vascular/Lymphatic: Aortic atherosclerosis. No enlarged abdominal or pelvic lymph nodes. Reproductive: Postop prostatectomy. Other: No abdominal wall hernia or abnormality. No abdominopelvic ascites. Musculoskeletal: Thoracolumbosacral degenerative changes. IMPRESSION: 1. Pericholecystic fluid and CBD dilatation. Recommend ultrasound evaluation. 2. Left  renal milimeter-sized cysts. 3. Aortic atherosclerosis (ICD10-I70.0). Electronically Signed   By: Sydell Eva M.D.   On: 09/05/2023 19:22   DG Abdomen 1 View Result Date: 09/05/2023 CLINICAL DATA:  Abdominal pain and constipation EXAM: ABDOMEN - 1 VIEW COMPARISON:  CT pelvis 09/01/2009 FINDINGS: Formed stool in the rectal vault with gas in the sigmoid colon, descending colon, distal transverse colon. Paucity of bowel gas in the right abdomen. Clustered loops of small bowel in the central to left abdomen measure up to 3 cm in diameter, borderline dilated. Levoconvex lumbar scoliosis  with rotary component. Lumbar spondylosis with loss of intervertebral disc height in the upper lumbar spine. Suspected spurring along the sacroiliac joints. IMPRESSION: 1. Formed stool in the rectal vault with gas in the sigmoid colon, descending colon, and distal transverse colon. 2. Clustered loops of small bowel in the central to left abdomen measure up to 3 cm in diameter, borderline dilated. This is nonspecific and could be due to ileus or partial small bowel obstruction. 3. Levoconvex lumbar scoliosis with rotary component. Lumbar spondylosis. Electronically Signed   By: Freida Jes M.D.   On: 09/05/2023 17:19     Assessment & Plan:  KERION WIEBELHAUS is a 78 y.o. male with acute  cholecystitis. Dilated CBD and bilirubin elevated, MRCP done but not read.   NPO  IV antibiotics Pending no choledocholithiasis on MRCP plan for OR.   PLAN: I counseled the patient about the indication, risks and benefits of robotic assisted laparoscopic cholecystectomy.  He understands there is a very small chance for bleeding, infection, injury to normal structures (including common bile duct), conversion to open surgery, persistent symptoms, evolution of postcholecystectomy diarrhea, need for secondary interventions, anesthesia reaction, cardiopulmonary issues and other risks not specifically detailed here. I described the  expected recovery, the plan for follow-up and the restrictions during the recovery phase.  All questions were answered.   All questions were answered to the satisfaction of the patient and family.    Antonio Hughes 09/06/2023, 1:02 PM

## 2023-09-06 NOTE — Anesthesia Preprocedure Evaluation (Signed)
 Anesthesia Evaluation  Patient identified by MRN, date of birth, ID band Patient awake    Reviewed: Allergy & Precautions, H&P , NPO status , Patient's Chart, lab work & pertinent test results, reviewed documented beta blocker date and time   Airway Mallampati: II  TM Distance: >3 FB Neck ROM: full    Dental no notable dental hx. (+) Dental Advisory Given, Teeth Intact   Pulmonary neg pulmonary ROS, former smoker   Pulmonary exam normal breath sounds clear to auscultation       Cardiovascular Exercise Tolerance: Good hypertension, Normal cardiovascular exam Rhythm:regular Rate:Normal     Neuro/Psych negative neurological ROS  negative psych ROS   GI/Hepatic negative GI ROS, Neg liver ROS,,,  Endo/Other  diabetes, Poorly Controlled, Type 2  Undiagnosed diabetes based on blood sugar  Renal/GU negative Renal ROS  negative genitourinary   Musculoskeletal   Abdominal   Peds  Hematology negative hematology ROS (+)   Anesthesia Other Findings Prostate cancer.  Sepsis  Reproductive/Obstetrics negative OB ROS                             Anesthesia Physical Anesthesia Plan  ASA: 3  Anesthesia Plan: General   Post-op Pain Management: Dilaudid IV   Induction:   PONV Risk Score and Plan: Ondansetron , Dexamethasone  and Midazolam   Airway Management Planned: Oral ETT  Additional Equipment: None  Intra-op Plan:   Post-operative Plan: Extubation in OR  Informed Consent: I have reviewed the patients History and Physical, chart, labs and discussed the procedure including the risks, benefits and alternatives for the proposed anesthesia with the patient or authorized representative who has indicated his/her understanding and acceptance.     Dental Advisory Given  Plan Discussed with: CRNA  Anesthesia Plan Comments:        Anesthesia Quick Evaluation

## 2023-09-06 NOTE — OR Nursing (Signed)
 Called to update son and let him know that everything was going well and we are still working and progressing with the surgery. Dr. Collene Dawson will be out to speak with them when she is finished.

## 2023-09-07 ENCOUNTER — Inpatient Hospital Stay (HOSPITAL_COMMUNITY)

## 2023-09-07 DIAGNOSIS — K810 Acute cholecystitis: Secondary | ICD-10-CM | POA: Diagnosis not present

## 2023-09-07 LAB — CBC
HCT: 30.9 % — ABNORMAL LOW (ref 39.0–52.0)
Hemoglobin: 11.3 g/dL — ABNORMAL LOW (ref 13.0–17.0)
MCH: 29.2 pg (ref 26.0–34.0)
MCHC: 36.6 g/dL — ABNORMAL HIGH (ref 30.0–36.0)
MCV: 79.8 fL — ABNORMAL LOW (ref 80.0–100.0)
Platelets: 219 10*3/uL (ref 150–400)
RBC: 3.87 MIL/uL — ABNORMAL LOW (ref 4.22–5.81)
RDW: 14 % (ref 11.5–15.5)
WBC: 15.3 10*3/uL — ABNORMAL HIGH (ref 4.0–10.5)
nRBC: 0 % (ref 0.0–0.2)

## 2023-09-07 LAB — COMPREHENSIVE METABOLIC PANEL WITH GFR
ALT: 123 U/L — ABNORMAL HIGH (ref 0–44)
AST: 144 U/L — ABNORMAL HIGH (ref 15–41)
Albumin: 2.6 g/dL — ABNORMAL LOW (ref 3.5–5.0)
Alkaline Phosphatase: 81 U/L (ref 38–126)
Anion gap: 11 (ref 5–15)
BUN: 52 mg/dL — ABNORMAL HIGH (ref 8–23)
CO2: 26 mmol/L (ref 22–32)
Calcium: 8.8 mg/dL — ABNORMAL LOW (ref 8.9–10.3)
Chloride: 101 mmol/L (ref 98–111)
Creatinine, Ser: 1.8 mg/dL — ABNORMAL HIGH (ref 0.61–1.24)
GFR, Estimated: 38 mL/min — ABNORMAL LOW (ref >60)
Glucose, Bld: 274 mg/dL — ABNORMAL HIGH (ref 70–99)
Potassium: 4.1 mmol/L (ref 3.5–5.1)
Sodium: 138 mmol/L (ref 135–145)
Total Bilirubin: 2 mg/dL — ABNORMAL HIGH (ref 0.0–1.2)
Total Protein: 6.8 g/dL (ref 6.5–8.1)

## 2023-09-07 LAB — MAGNESIUM: Magnesium: 2.3 mg/dL (ref 1.7–2.4)

## 2023-09-07 MED ORDER — LACTATED RINGERS IV SOLN: INTRAVENOUS | Status: DC

## 2023-09-07 MED ORDER — PANTOPRAZOLE SODIUM 40 MG IV SOLR
40.0000 mg | INTRAVENOUS | Status: DC
  Administered 2023-09-07 – 2023-09-11 (×5): 40 mg via INTRAVENOUS
  Filled 2023-09-07 (×5): qty 10

## 2023-09-07 MED ORDER — SODIUM CHLORIDE 0.9 % IV SOLN
12.5000 mg | Freq: Four times a day (QID) | INTRAVENOUS | Status: DC | PRN
  Administered 2023-09-07: 12.5 mg via INTRAVENOUS
  Filled 2023-09-07 (×2): qty 0.5

## 2023-09-07 NOTE — Progress Notes (Signed)
 Pt up to bedside x 2 with minimal discomfort. Requested Tylenol  x 2 for pain; adamant about not receiving morphine . Pt is belching, bowel sounds active. Dressing is clean, dry and intact. Wife at bedside.

## 2023-09-07 NOTE — Progress Notes (Signed)
 [facility]  Distended and wanting to throw up. Has been spitting up. NG placed at bedside, 500 cc output dark/ with some coffee ground.   BP 134/81 (BP Location: Left Arm)   Pulse 77   Temp 98.2 F (36.8 C) (Oral)   Resp 18   Ht 5\' 6"  (1.676 m)   Wt 59.8 kg   SpO2 96%   BMI 21.28 kg/m  Distended, port sites c/d/I with d ressing, JP with dark SS output, no sign of bile   AST and ALT up from cautery on liver T bili 2.0 from 1.9   WBC down to 15   Patient s/p robotic cholecystectomy ,JP drain placement for gangrenous cholecystitis with perforation. PRN morphine  Added protonix IV NG in place, NPO with ice and limited oral med CXR ordered to confirm  Labs tomorrow Zosyn  for 5 days post op can end 5/7 JP in place, monitor drainage   Updated team.   Deena Farrier, MD [facility] [address] [place] [place], [place] [address] [phone] (office)

## 2023-09-07 NOTE — Progress Notes (Signed)
 [facility]  NG in stomach below diaphragm. The bowel looks dilated.  Deena Farrier, MD [facility] [address] [place] [place] [place], [place] [address] [phone] (office)

## 2023-09-07 NOTE — Progress Notes (Signed)
 PROGRESS NOTE    Antonio Hughes  ZOX:[id number] DOB: 1946/03/23 DOA: 09/05/2023 PCP: Wyvonna Heidelberg, MD   Brief Narrative:    Antonio Hughes is a 78 y.o. male with medical history significant for hypertension, prostate cancer.  Patient presented to the ED with complaints of generalized weakness, poor oral intake, right upper quadrant pain over the past 4 days.  Patient admitted with sepsis secondary to acute cholecystitis with noted CBD dilation and MRCP does not show any signs of choledocholithiasis.  General surgery consulted and patient underwent robotic assisted laparoscopic cholecystectomy and was noted to have acute gangrenous cholecystitis with perforation on 5/2.  Assessment & Plan:   Principal Problem:   Acute gangrenous cholecystitis Active Problems:   Sepsis (HCC)   HTN (hypertension)   Prostate cancer (HCC)   Dilated cbd, acquired   Cholecystitis with perforation of gallbladder  Assessment and Plan:   Acute cholecystitis Meeting sepsis criteria with fever of 100.1, leukocytosis of 20.1.  T. bili elevated at 1.9 otherwise normal liver enzymes.  Normal lipase 28.  CT abdomen and pelvis with contrast- Pericholecystic fluid and the CBD dilatation. No intrahepatic biliary ductal dilatation. No hepatic parenchymal abnormalities.  Ultrasound evaluation recommended. - Status post robotic assisted laparoscopic cholecystectomy with noted acute gangrenous cholecystitis with perforation on 5/2 -Continue to monitor labs -IV fluid and dietary advancement   Sepsis (HCC) Sepsis criteria with leukocytosis of 20.1, fever of 100.1.  Likely secondary to acute cholecystitis, UTI also possible.  With UA showing nitrites and leukocytes and many bacteria. - Related to above status post cholecystectomy -Blood cultures with no growth noted thus far -Urine cultures with noted gram-negative rods, continue to follow as UA did demonstrate some signs of UTI and continue on Zosyn     Prostate cancer [facility]) Status post prostatectomy.   HTN (hypertension) Currently with soft blood pressure readings -Hold chlorthalidone  to allow for hydration -Resume diltiazem    DVT prophylaxis: SCDs Code Status: Full Family Communication: Spouse at bedside 5/3 Disposition Plan:  Status is: Inpatient Remains inpatient appropriate because: Need for IV medications.  Consultants:  General Surgery  Procedures:  Robotic assisted laparoscopic cholecystectomy 5/2  Antimicrobials:  Anti-infectives (From admission, onward)    Start     Dose/Rate Route Frequency Ordered Stop   09/06/23 1400  cefoTEtan  (CEFOTAN ) 2 g in sodium chloride  0.9 % 100 mL IVPB        2 g 200 mL/hr over 30 Minutes Intravenous On call to O.R. 09/06/23 1341 09/06/23 1510   09/06/23 0200  piperacillin -tazobactam (ZOSYN ) IVPB 3.375 g        3.375 g 12.5 mL/hr over 240 Minutes Intravenous Every 8 hours 09/05/23 2224     09/05/23 1945  piperacillin -tazobactam (ZOSYN ) IVPB 3.375 g  Status:  Discontinued        3.375 g 100 mL/hr over 30 Minutes Intravenous Every 6 hours 09/05/23 1939 09/05/23 2223      Subjective: Patient seen and evaluated today with some relief of abdominal pain this morning.  He denies any nausea or vomiting.  He does complain of feeling weak.  No acute overnight events noted since cholecystectomy yesterday.  Objective: Vitals:   09/06/23 1849 09/06/23 2057 09/07/23 0207 09/07/23 0435  BP: 112/64 114/66 115/63 121/76  Pulse:  65 65 61  Resp: 20 18 18 17   Temp:  99.2 F (37.3 C) 98.3 F (36.8 C) 98.1 F (36.7 C)  TempSrc:  Oral Oral Oral  SpO2: 98% 99% 98% 98%  Weight:  Height:        Intake/Output Summary (Last 24 hours) at 09/07/2023 1012 Last data filed at 09/07/2023 0830 Gross per 24 hour  Intake 1800 ml  Output 700 ml  Net 1100 ml   Filed Weights   09/05/23 1619 09/05/23 2304 09/06/23 1417  Weight: 65.8 kg 59.8 kg 59.8 kg    Examination:  General exam: Appears calm  and comfortable  Respiratory system: Clear to auscultation. Respiratory effort normal. Cardiovascular system: S1 & S2 heard, RRR.  Gastrointestinal system: Dressings C/D/I with drain present with serosanguineous drainage Central nervous system: Alert and awake Extremities: No edema Skin: No significant lesions noted Psychiatry: Flat affect.    Data Reviewed: I have personally reviewed following labs and imaging studies  CBC: Recent Labs  Lab 09/05/23 1646 09/06/23 0502 09/07/23 0428  WBC 20.1* 20.7* 15.3*  NEUTROABS 17.4*  --   --   HGB 14.5 13.4 11.3*  HCT 41.0 37.6* 30.9*  MCV 79.2* 79.0* 79.8*  PLT 239 207 219   Basic Metabolic Panel: Recent Labs  Lab 09/05/23 1646 09/06/23 0502 09/07/23 0428  NA 135 135 138  K 3.9 3.6 4.1  CL 98 100 101  CO2 26 25 26   GLUCOSE 179* 173* 274*  BUN 31* 30* 52*  CREATININE 1.17 1.10 1.80*  CALCIUM 10.0 9.6 8.8*  MG  --   --  2.3   GFR: Estimated Creatinine Clearance: 29.1 mL/min (A) (by C-G formula based on SCr of 1.8 mg/dL (H)). Liver Function Tests: Recent Labs  Lab 09/05/23 1646 09/06/23 0502 09/07/23 0428  AST 39 33 144*  ALT 46* 44 123*  ALKPHOS 74 78 81  BILITOT 1.9* 1.9* 2.0*  PROT 8.0 7.4 6.8  ALBUMIN 3.7 3.2* 2.6*   Recent Labs  Lab 09/05/23 1646  LIPASE 28   No results for input(s): "AMMONIA" in the last 168 hours. Coagulation Profile: No results for input(s): "INR", "PROTIME" in the last 168 hours. Cardiac Enzymes: No results for input(s): "CKTOTAL", "CKMB", "CKMBINDEX", "TROPONINI" in the last 168 hours. BNP (last 3 results) No results for input(s): "PROBNP" in the last 8760 hours. HbA1C: No results for input(s): "HGBA1C" in the last 72 hours. CBG: Recent Labs  Lab 09/05/23 1615  GLUCAP 199*   Lipid Profile: No results for input(s): "CHOL", "HDL", "LDLCALC", "TRIG", "CHOLHDL", "LDLDIRECT" in the last 72 hours. Thyroid  Function Tests: No results for input(s): "TSH", "T4TOTAL", "FREET4",  "T3FREE", "THYROIDAB" in the last 72 hours. Anemia Panel: No results for input(s): "VITAMINB12", "FOLATE", "FERRITIN", "TIBC", "IRON", "RETICCTPCT" in the last 72 hours. Sepsis Labs: Recent Labs  Lab 09/05/23 2240  LATICACIDVEN 1.2    Recent Results (from the past 240 hours)  Urine Culture (for pregnant, neutropenic or urologic patients or patients with an indwelling urinary catheter)     Status: Abnormal (Preliminary result)   Collection Time: 09/05/23  7:45 PM   Specimen: Urine, Clean Catch  Result Value Ref Range Status   Specimen Description   Final    URINE, CLEAN CATCH Performed at [facility], [address], [place], [place] [address]    Special Requests   Final    NONE Performed at [facility], [address], [place], [place] [address]    Culture (A)  Final    60,000 COLONIES/mL GRAM NEGATIVE RODS SUSCEPTIBILITIES TO FOLLOW Performed at [facility], [address] [place], [place] [address]    Report Status PENDING  Incomplete  Culture, blood (Routine X 2) w Reflex to  ID Panel     Status: None (Preliminary result)   Collection Time: 09/05/23 10:39 PM   Specimen: BLOOD  Result Value Ref Range Status   Specimen Description BLOOD BLOOD LEFT ARM  Final   Special Requests   Final    BOTTLES DRAWN AEROBIC AND ANAEROBIC Blood Culture adequate volume   Culture   Final    NO GROWTH 2 DAYS Performed at [facility], [address] [place], [place] [address]    Report Status PENDING  Incomplete  Culture, blood (Routine X 2) w Reflex to ID Panel     Status: None (Preliminary result)   Collection Time: 09/05/23 10:40 PM   Specimen: BLOOD  Result Value Ref Range Status   Specimen Description BLOOD BLOOD LEFT HAND  Final   Special Requests   Final    BOTTLES DRAWN AEROBIC AND ANAEROBIC Blood Culture adequate volume   Culture   Final    NO GROWTH 2 DAYS Performed at [facility], [address], [place], [place] [address]    Report Status PENDING  Incomplete          Radiology Studies: MR ABDOMEN MRCP W WO CONTAST Result Date: 09/06/2023 CLINICAL DATA:  Abdominal pain, elevated T. bili, CT shows CBD dilatation and suggest cholecystitis. EXAM: MRI ABDOMEN WITHOUT AND WITH CONTRAST (INCLUDING MRCP) TECHNIQUE: Multiplanar multisequence MR imaging of the abdomen was performed both before and after the administration of intravenous contrast. Heavily T2-weighted images of the biliary and pancreatic ducts were obtained, and three-dimensional MRCP images were rendered by post processing. CONTRAST:  6mL GADAVIST GADOBUTROL 1 MMOL/ML IV SOLN COMPARISON:  CT scan abdomen and pelvis from 09/05/2023. FINDINGS: Lower chest: Unremarkable MR appearance to the lung bases. No pleural effusion. No pericardial effusion. Normal heart size. Hepatobiliary: The liver is normal in size and configuration. There is a single subcapsular 5 mm cyst noted in the right hepatic lobe, segment 7. No intra or extrahepatic bile duct dilation. No choledocholithiasis. The gallbladder is distended. There is diffuse moderate to marked gallbladder wall swelling/edema and pericholecystic fat stranding/free fluid. There are several areas of outpouching from the gallbladder lumen (series 16, images 44 and 48), which are nonspecific but concerning for contained perforation versus gallbladder diverticula. There are moderate volume gallstones including a gallstone in the neck. Pancreas: No mass, inflammatory changes or other parenchymal abnormality identified. No main pancreatic duct dilation. Spleen:  Within normal limits in size and appearance. No focal mass. Adrenals/Urinary Tract: Unremarkable adrenal glands. No hydroureteronephrosis. No suspicious renal mass. There are at least 2 simple cysts noted in the left kidney. Stomach/Bowel: Visualized portions within the abdomen are unremarkable. No disproportionate dilation of bowel loops. Vascular/Lymphatic: No pathologically enlarged lymph nodes identified. No  abdominal aortic aneurysm demonstrated. No ascites. Other: There is trace amount of ascites in the right paracolic gutter. Musculoskeletal: No suspicious bone lesions identified. IMPRESSION: 1. Findings favor acute cholecystitis with several areas of outpouching from the gallbladder lumen, which are nonspecific but concerning for contained perforation versus gallbladder diverticula. 2. No choledocholithiasis or bile duct dilation. 3. Multiple other nonacute observations, as described above. Electronically Signed   By: Beula Brunswick M.D.   On: 09/06/2023 13:24   MR 3D Recon At Scanner Result Date: 09/06/2023 CLINICAL DATA:  Abdominal pain, elevated T. bili, CT shows CBD dilatation and suggest cholecystitis. EXAM: MRI ABDOMEN WITHOUT AND WITH CONTRAST (INCLUDING MRCP) TECHNIQUE: Multiplanar multisequence MR imaging of the abdomen was performed both before and after the administration of intravenous contrast.  Heavily T2-weighted images of the biliary and pancreatic ducts were obtained, and three-dimensional MRCP images were rendered by post processing. CONTRAST:  6mL GADAVIST GADOBUTROL 1 MMOL/ML IV SOLN COMPARISON:  CT scan abdomen and pelvis from 09/05/2023. FINDINGS: Lower chest: Unremarkable MR appearance to the lung bases. No pleural effusion. No pericardial effusion. Normal heart size. Hepatobiliary: The liver is normal in size and configuration. There is a single subcapsular 5 mm cyst noted in the right hepatic lobe, segment 7. No intra or extrahepatic bile duct dilation. No choledocholithiasis. The gallbladder is distended. There is diffuse moderate to marked gallbladder wall swelling/edema and pericholecystic fat stranding/free fluid. There are several areas of outpouching from the gallbladder lumen (series 16, images 44 and 48), which are nonspecific but concerning for contained perforation versus gallbladder diverticula. There are moderate volume gallstones including a gallstone in the neck. Pancreas: No  mass, inflammatory changes or other parenchymal abnormality identified. No main pancreatic duct dilation. Spleen:  Within normal limits in size and appearance. No focal mass. Adrenals/Urinary Tract: Unremarkable adrenal glands. No hydroureteronephrosis. No suspicious renal mass. There are at least 2 simple cysts noted in the left kidney. Stomach/Bowel: Visualized portions within the abdomen are unremarkable. No disproportionate dilation of bowel loops. Vascular/Lymphatic: No pathologically enlarged lymph nodes identified. No abdominal aortic aneurysm demonstrated. No ascites. Other: There is trace amount of ascites in the right paracolic gutter. Musculoskeletal: No suspicious bone lesions identified. IMPRESSION: 1. Findings favor acute cholecystitis with several areas of outpouching from the gallbladder lumen, which are nonspecific but concerning for contained perforation versus gallbladder diverticula. 2. No choledocholithiasis or bile duct dilation. 3. Multiple other nonacute observations, as described above. Electronically Signed   By: Beula Brunswick M.D.   On: 09/06/2023 13:24   CT ABDOMEN PELVIS W CONTRAST Result Date: 09/05/2023 CLINICAL DATA:  Anorexia and weakness. EXAM: CT ABDOMEN AND PELVIS WITH CONTRAST TECHNIQUE: Multidetector CT imaging of the abdomen and pelvis was performed using the standard protocol following bolus administration of intravenous contrast. RADIATION DOSE REDUCTION: This exam was performed according to the departmental dose-optimization program which includes automated exposure control, adjustment of the mA and/or kV according to patient size and/or use of iterative reconstruction technique. CONTRAST:  OMNIPAQUE  IOHEXOL  300 MG/ML  SOLN COMPARISON:  09/01/2009. FINDINGS: Lower chest: No acute abnormality. No pleural or pericardial effusions. Hepatobiliary: Pericholecystic fluid and the CBD dilatation. No intrahepatic biliary ductal dilatation. No hepatic parenchymal  abnormalities. Pancreas: Unremarkable. No pancreatic ductal dilatation or surrounding inflammatory changes. Spleen: Normal in size without focal abnormality. Adrenals/Urinary Tract: No adrenal lesions. There are couple of mm sized hypodensities in left kidney consistent with cyst. No nephrolithiasis or hydronephrosis. Unremarkable urinary bladder. Stomach/Bowel: Stomach is within normal limits. Appendix appears normal. No evidence of bowel wall thickening, distention, or inflammatory changes. Vascular/Lymphatic: Aortic atherosclerosis. No enlarged abdominal or pelvic lymph nodes. Reproductive: Postop prostatectomy. Other: No abdominal wall hernia or abnormality. No abdominopelvic ascites. Musculoskeletal: Thoracolumbosacral degenerative changes. IMPRESSION: 1. Pericholecystic fluid and CBD dilatation. Recommend ultrasound evaluation. 2. Left renal milimeter-sized cysts. 3. Aortic atherosclerosis (ICD10-I70.0). Electronically Signed   By: Sydell Eva M.D.   On: 09/05/2023 19:22   DG Abdomen 1 View Result Date: 09/05/2023 CLINICAL DATA:  Abdominal pain and constipation EXAM: ABDOMEN - 1 VIEW COMPARISON:  CT pelvis 09/01/2009 FINDINGS: Formed stool in the rectal vault with gas in the sigmoid colon, descending colon, distal transverse colon. Paucity of bowel gas in the right abdomen. Clustered loops of small bowel in the central to left  abdomen measure up to 3 cm in diameter, borderline dilated. Levoconvex lumbar scoliosis with rotary component. Lumbar spondylosis with loss of intervertebral disc height in the upper lumbar spine. Suspected spurring along the sacroiliac joints. IMPRESSION: 1. Formed stool in the rectal vault with gas in the sigmoid colon, descending colon, and distal transverse colon. 2. Clustered loops of small bowel in the central to left abdomen measure up to 3 cm in diameter, borderline dilated. This is nonspecific and could be due to ileus or partial small bowel obstruction. 3. Levoconvex  lumbar scoliosis with rotary component. Lumbar spondylosis. Electronically Signed   By: Freida Jes M.D.   On: 09/05/2023 17:19        Scheduled Meds:  diltiazem   360 mg Oral Daily   docusate sodium   100 mg Oral BID   LORazepam   0.5 mg Intravenous Once   Continuous Infusions:  lactated ringers  75 mL/hr at 09/07/23 1610   piperacillin -tazobactam (ZOSYN )  IV 3.375 g (09/07/23 0058)     LOS: 2 days    Time spent: 55 minutes    Miquela Costabile Loran Rock, DO [facility]  If 7PM-7AM, please contact night-coverage [email or web address] 09/07/2023, 10:12 AM

## 2023-09-07 NOTE — Plan of Care (Signed)

## 2023-09-08 DIAGNOSIS — K82A2 Perforation of gallbladder in cholecystitis: Secondary | ICD-10-CM

## 2023-09-08 DIAGNOSIS — C61 Malignant neoplasm of prostate: Secondary | ICD-10-CM | POA: Diagnosis not present

## 2023-09-08 DIAGNOSIS — K810 Acute cholecystitis: Secondary | ICD-10-CM | POA: Diagnosis not present

## 2023-09-08 DIAGNOSIS — A419 Sepsis, unspecified organism: Secondary | ICD-10-CM | POA: Diagnosis not present

## 2023-09-08 DIAGNOSIS — I10 Essential (primary) hypertension: Secondary | ICD-10-CM | POA: Diagnosis not present

## 2023-09-08 DIAGNOSIS — K838 Other specified diseases of biliary tract: Secondary | ICD-10-CM

## 2023-09-08 LAB — OCCULT BLOOD GASTRIC / DUODENUM (SPECIMEN CUP)
Occult Blood, Gastric: POSITIVE — AB
pH, Gastric: 5

## 2023-09-08 LAB — CBC
HCT: 35.5 % — ABNORMAL LOW (ref 39.0–52.0)
Hemoglobin: 12.9 g/dL — ABNORMAL LOW (ref 13.0–17.0)
MCH: 29 pg (ref 26.0–34.0)
MCHC: 36.3 g/dL — ABNORMAL HIGH (ref 30.0–36.0)
MCV: 79.8 fL — ABNORMAL LOW (ref 80.0–100.0)
Platelets: 316 10*3/uL (ref 150–400)
RBC: 4.45 MIL/uL (ref 4.22–5.81)
RDW: 13.7 % (ref 11.5–15.5)
WBC: 15.8 10*3/uL — ABNORMAL HIGH (ref 4.0–10.5)
nRBC: 0 % (ref 0.0–0.2)

## 2023-09-08 LAB — COMPREHENSIVE METABOLIC PANEL WITH GFR
ALT: 98 U/L — ABNORMAL HIGH (ref 0–44)
AST: 61 U/L — ABNORMAL HIGH (ref 15–41)
Albumin: 2.7 g/dL — ABNORMAL LOW (ref 3.5–5.0)
Alkaline Phosphatase: 88 U/L (ref 38–126)
Anion gap: 10 (ref 5–15)
BUN: 58 mg/dL — ABNORMAL HIGH (ref 8–23)
CO2: 26 mmol/L (ref 22–32)
Calcium: 9.3 mg/dL (ref 8.9–10.3)
Chloride: 103 mmol/L (ref 98–111)
Creatinine, Ser: 1.73 mg/dL — ABNORMAL HIGH (ref 0.61–1.24)
GFR, Estimated: 40 mL/min — ABNORMAL LOW (ref >60)
Glucose, Bld: 186 mg/dL — ABNORMAL HIGH (ref 70–99)
Potassium: 3.8 mmol/L (ref 3.5–5.1)
Sodium: 139 mmol/L (ref 135–145)
Total Bilirubin: 1.2 mg/dL (ref 0.0–1.2)
Total Protein: 7.2 g/dL (ref 6.5–8.1)

## 2023-09-08 LAB — URINE CULTURE: Culture: 60000 — AB

## 2023-09-08 LAB — MAGNESIUM: Magnesium: 2.8 mg/dL — ABNORMAL HIGH (ref 1.7–2.4)

## 2023-09-08 MED ORDER — METOPROLOL TARTRATE 5 MG/5ML IV SOLN
2.5000 mg | Freq: Three times a day (TID) | INTRAVENOUS | Status: DC
  Administered 2023-09-08 – 2023-09-12 (×12): 2.5 mg via INTRAVENOUS
  Filled 2023-09-08 (×13): qty 5

## 2023-09-08 NOTE — Progress Notes (Addendum)
 PT's abdomen appears more distended and taut. NG tube dressing had come lose. NG was advanced and contents began to drain. He has moderate abdominal pain. JP drain charged and putting out bloody drainage putting out Jenna Dr Elyse Hand was inofrmd. Jeanice Millard RN

## 2023-09-08 NOTE — Progress Notes (Signed)
 PROGRESS NOTE    Antonio Hughes  IHK:[id number] DOB: 06/03/1945 DOA: 09/05/2023 PCP: Wyvonna Heidelberg, MD   Brief Narrative:    Antonio Hughes is a 78 y.o. male with medical history significant for hypertension, prostate cancer.  Patient presented to the ED with complaints of generalized weakness, poor oral intake, right upper quadrant pain over the past 4 days.  Patient admitted with sepsis secondary to acute cholecystitis with noted CBD dilation and MRCP does not show any signs of choledocholithiasis.  General surgery consulted and patient underwent robotic assisted laparoscopic cholecystectomy and was noted to have acute gangrenous cholecystitis with perforation on 5/2.  Assessment & Plan:   Principal Problem:   Acute gangrenous cholecystitis Active Problems:   Sepsis (HCC)   HTN (hypertension)   Prostate cancer (HCC)   Dilated cbd, acquired   Cholecystitis with perforation of gallbladder  Assessment and Plan:   Acute cholecystitis Meeting sepsis criteria with fever of 100.1, leukocytosis of 20.1.  T. bili elevated at 1.9 otherwise normal liver enzymes.  Normal lipase 28.  CT abdomen and pelvis with contrast- Pericholecystic fluid and the CBD dilatation. No intrahepatic biliary ductal dilatation. No hepatic parenchymal abnormalities.  Ultrasound evaluation recommended. - Status post robotic assisted laparoscopic cholecystectomy with noted acute gangrenous cholecystitis with perforation on 5/2 -Continue to monitor labs - Continue to maintain adequate hydration and follow general surgery recommendations for diet advancements and further postoperative care.   Sepsis (HCC) Sepsis criteria with leukocytosis of 20.1, fever of 100.1 at time of admission.  Likely secondary to acute cholecystitis and UTI  -Afebrile and WBC is trending down. -Continue current IV antibiotics - Related to above status post cholecystectomy -Blood cultures with no growth so far. -Urine  cultures with noted isolated E. coli species; sensitive to current antibiotics.  Prostate cancer [facility]) -Status post prostatectomy. -Continue outpatient follow-up with urology/oncology service. - Appears to be in remission.   HTN (hypertension) -Holding oral antihypertensive agents at the moment - Low-dose Lopressor IV has been started.  DVT prophylaxis: SCDs Code Status: Full Family Communication: No Emily at bedside on today's evaluation. Disposition Plan:  Status is: Inpatient Remains inpatient appropriate because: Need for IV medications.  Consultants:  General Surgery  Procedures:  Robotic assisted laparoscopic cholecystectomy 5/2  Antimicrobials:  Anti-infectives (From admission, onward)    Start     Dose/Rate Route Frequency Ordered Stop   09/06/23 1400  cefoTEtan  (CEFOTAN ) 2 g in sodium chloride  0.9 % 100 mL IVPB        2 g 200 mL/hr over 30 Minutes Intravenous On call to O.R. 09/06/23 1341 09/06/23 1510   09/06/23 0200  piperacillin -tazobactam (ZOSYN ) IVPB 3.375 g        3.375 g 12.5 mL/hr over 240 Minutes Intravenous Every 8 hours 09/05/23 2224     09/05/23 1945  piperacillin -tazobactam (ZOSYN ) IVPB 3.375 g  Status:  Discontinued        3.375 g 100 mL/hr over 30 Minutes Intravenous Every 6 hours 09/05/23 1939 09/05/23 2223      Subjective: Afebrile, no chest pain, reporting intermittent nausea but no further vomiting.  NG tube in place.  No bowel movements.  Objective: Vitals:   09/07/23 1326 09/07/23 2045 09/08/23 0428 09/08/23 1355  BP: 134/81 (!) 145/88 (!) 139/98 (!) 145/85  Pulse: 77 84 90 84  Resp: 18 18 18    Temp: 98.2 F (36.8 C) 98.3 F (36.8 C) 98.2 F (36.8 C) 98.4 F (36.9 C)  TempSrc: Oral Oral Oral  Oral  SpO2: 96% 98% 99% 100%  Weight:      Height:        Intake/Output Summary (Last 24 hours) at 09/08/2023 1730 Last data filed at 09/08/2023 0800 Gross per 24 hour  Intake 518.57 ml  Output 275 ml  Net 243.57 ml   Filed Weights    09/05/23 1619 09/05/23 2304 09/06/23 1417  Weight: 65.8 kg 59.8 kg 59.8 kg    Examination: General exam: Alert, awake, oriented x 3; no chest pain, still reporting mild intermittent nausea but no further vomiting.  NG tube in place. Respiratory system: Good saturation on room air. Cardiovascular system: Rate controlled, no rubs, no gallops, no JVD on exam. Gastrointestinal system: Abdomen is mildly distended and appropriately tender on palpation; no bowel sounds appreciated on exam.  JP drain in place (serosanguineous output appreciated). Central nervous system:No focal neurological deficits. Extremities: No cyanosis, clubbing or edema. Skin: No petechiae. Psychiatry: Judgement and insight appear normal.    Data Reviewed: I have personally reviewed following labs and imaging studies  CBC: Recent Labs  Lab 09/05/23 1646 09/06/23 0502 09/07/23 0428 09/08/23 0432  WBC 20.1* 20.7* 15.3* 15.8*  NEUTROABS 17.4*  --   --   --   HGB 14.5 13.4 11.3* 12.9*  HCT 41.0 37.6* 30.9* 35.5*  MCV 79.2* 79.0* 79.8* 79.8*  PLT 239 207 219 316   Basic Metabolic Panel: Recent Labs  Lab 09/05/23 1646 09/06/23 0502 09/07/23 0428 09/08/23 0432  NA 135 135 138 139  K 3.9 3.6 4.1 3.8  CL 98 100 101 103  CO2 26 25 26 26   GLUCOSE 179* 173* 274* 186*  BUN 31* 30* 52* 58*  CREATININE 1.17 1.10 1.80* 1.73*  CALCIUM 10.0 9.6 8.8* 9.3  MG  --   --  2.3 2.8*   GFR: Estimated Creatinine Clearance: 30.2 mL/min (A) (by C-G formula based on SCr of 1.73 mg/dL (H)).  Liver Function Tests: Recent Labs  Lab 09/05/23 1646 09/06/23 0502 09/07/23 0428 09/08/23 0432  AST 39 33 144* 61*  ALT 46* 44 123* 98*  ALKPHOS 74 78 81 88  BILITOT 1.9* 1.9* 2.0* 1.2  PROT 8.0 7.4 6.8 7.2  ALBUMIN 3.7 3.2* 2.6* 2.7*   Recent Labs  Lab 09/05/23 1646  LIPASE 28   CBG: Recent Labs  Lab 09/05/23 1615  GLUCAP 199*   Sepsis Labs: Recent Labs  Lab 09/05/23 2240  LATICACIDVEN 1.2    Recent Results  (from the past 240 hours)  Urine Culture (for pregnant, neutropenic or urologic patients or patients with an indwelling urinary catheter)     Status: Abnormal   Collection Time: 09/05/23  7:45 PM   Specimen: Urine, Clean Catch  Result Value Ref Range Status   Specimen Description   Final    URINE, CLEAN CATCH Performed at [facility], [address], [place], [place] [address]    Special Requests   Final    NONE Performed at [facility], [address], [place], [place] [address]    Culture 60,000 COLONIES/mL KLEBSIELLA PNEUMONIAE (A)  Final   Report Status 09/08/2023 FINAL  Final   Organism ID, Bacteria KLEBSIELLA PNEUMONIAE (A)  Final      Susceptibility   Klebsiella pneumoniae - MIC*    AMPICILLIN >=32 RESISTANT Resistant     CEFAZOLIN  <=4 SENSITIVE Sensitive     CEFEPIME <=0.12 SENSITIVE Sensitive     CEFTRIAXONE <=0.25 SENSITIVE Sensitive     CIPROFLOXACIN <=0.25 SENSITIVE Sensitive  GENTAMICIN <=1 SENSITIVE Sensitive     IMIPENEM <=0.25 SENSITIVE Sensitive     NITROFURANTOIN <=16 SENSITIVE Sensitive     TRIMETH /SULFA  <=20 SENSITIVE Sensitive     AMPICILLIN/SULBACTAM 16 INTERMEDIATE Intermediate     PIP/TAZO <=4 SENSITIVE Sensitive ug/mL    * 60,000 COLONIES/mL KLEBSIELLA PNEUMONIAE  Culture, blood (Routine X 2) w Reflex to ID Panel     Status: None (Preliminary result)   Collection Time: 09/05/23 10:39 PM   Specimen: BLOOD  Result Value Ref Range Status   Specimen Description BLOOD BLOOD LEFT ARM  Final   Special Requests   Final    BOTTLES DRAWN AEROBIC AND ANAEROBIC Blood Culture adequate volume   Culture   Final    NO GROWTH 3 DAYS Performed at [facility], [address] [place], [place] [address]    Report Status PENDING  Incomplete  Culture, blood (Routine X 2) w Reflex to ID Panel     Status: None (Preliminary result)   Collection Time: 09/05/23 10:40 PM   Specimen: BLOOD  Result Value Ref Range Status   Specimen Description BLOOD BLOOD LEFT HAND  Final    Special Requests   Final    BOTTLES DRAWN AEROBIC AND ANAEROBIC Blood Culture adequate volume   Culture   Final    NO GROWTH 3 DAYS Performed at [facility], [address] [place], [place] [address]    Report Status PENDING  Incomplete    Radiology Studies: DG Chest Port 1 View Result Date: 09/07/2023 CLINICAL DATA:  Status post nasogastric tube placement. Status post cholecystectomy yesterday. EXAM: PORTABLE CHEST 1 VIEW COMPARISON:  10/27/2022 FINDINGS: Borderline enlarged heart. Interval small amount of patchy and linear density at the left lung base and small amount of linear density at the right lung base interval nasogastric tube extending into the stomach with its tip and side hole in the proximal to mid stomach. Lumbar spine degenerative changes and mild scoliosis. Right mid to upper abdomen surgical drain. Mildly dilated gas-filled small bowel and colon, compatible with postoperative ileus. IMPRESSION: 1. Interval nasogastric tube extending into the stomach with its tip and side hole in the proximal to mid stomach. 2. Interval small amount of bibasilar atelectasis. 3. Mild postoperative ileus. Electronically Signed   By: Catherin Closs M.D.   On: 09/07/2023 17:10   Scheduled Meds:  LORazepam   0.5 mg Intravenous Once   metoprolol tartrate  2.5 mg Intravenous Q8H   pantoprazole (PROTONIX) IV  40 mg Intravenous Q24H   Continuous Infusions:  lactated ringers  75 mL/hr at 09/08/23 1632   piperacillin -tazobactam (ZOSYN )  IV 3.375 g (09/08/23 1033)   promethazine (PHENERGAN) injection (IM or IVPB) Stopped (09/07/23 1616)     LOS: 3 days    Time spent: 50 minutes    Justina Oman, MD [facility]  If 7PM-7AM, please contact night-coverage [email or web address] 09/08/2023, 5:30 PM

## 2023-09-08 NOTE — Progress Notes (Signed)
 [facility] Progress Note  2 Days Post-Op  Subjective: Distended, NG output  dark and thick. JP with bloody output but thinner.   Objective: Vital signs in last 24 hours: Temp:  [98.2 F (36.8 C)-98.4 F (36.9 C)] 98.4 F (36.9 C) (05/04 1355) Pulse Rate:  [84-90] 84 (05/04 1355) Resp:  [18] 18 (05/04 0428) BP: (139-145)/(85-98) 145/85 (05/04 1355) SpO2:  [98 %-100 %] 100 % (05/04 1355) Last BM Date : 09/03/23  Intake/Output from previous day: 05/03 0701 - 05/04 0700 In: 758.6 [P.O.:240; I.V.:298.7; IV Piggyback:219.9] Out: 1190 [Urine:700; Emesis/NG output:450; Drains:40] Intake/Output this shift: Total I/O In: -  Out: 250 [Emesis/NG output:250]  General appearance: alert and no distress GI: soft, JP with ss output, staples c/d/I, removed bandages, no further drainage from the JP site noted at the skin, replaced   Lab Results:  Recent Labs    09/07/23 0428 09/08/23 0432  WBC 15.3* 15.8*  HGB 11.3* 12.9*  HCT 30.9* 35.5*  PLT 219 316   BMET Recent Labs    09/07/23 0428 09/08/23 0432  NA 138 139  K 4.1 3.8  CL 101 103  CO2 26 26  GLUCOSE 274* 186*  BUN 52* 58*  CREATININE 1.80* 1.73*  CALCIUM 8.8* 9.3   PT/INR No results for input(s): "LABPROT", "INR" in the last 72 hours.  Studies/Results: DG Chest Port 1 View Result Date: 09/07/2023 CLINICAL DATA:  Status post nasogastric tube placement. Status post cholecystectomy yesterday. EXAM: PORTABLE CHEST 1 VIEW COMPARISON:  10/27/2022 FINDINGS: Borderline enlarged heart. Interval small amount of patchy and linear density at the left lung base and small amount of linear density at the right lung base interval nasogastric tube extending into the stomach with its tip and side hole in the proximal to mid stomach. Lumbar spine degenerative changes and mild scoliosis. Right mid to upper abdomen surgical drain. Mildly dilated gas-filled small bowel and colon, compatible with postoperative ileus.  IMPRESSION: 1. Interval nasogastric tube extending into the stomach with its tip and side hole in the proximal to mid stomach. 2. Interval small amount of bibasilar atelectasis. 3. Mild postoperative ileus. Electronically Signed   By: Catherin Closs M.D.   On: 09/07/2023 17:10    Anti-infectives: Anti-infectives (From admission, onward)    Start     Dose/Rate Route Frequency Ordered Stop   09/06/23 1400  cefoTEtan  (CEFOTAN ) 2 g in sodium chloride  0.9 % 100 mL IVPB        2 g 200 mL/hr over 30 Minutes Intravenous On call to O.R. 09/06/23 1341 09/06/23 1510   09/06/23 0200  piperacillin -tazobactam (ZOSYN ) IVPB 3.375 g        3.375 g 12.5 mL/hr over 240 Minutes Intravenous Every 8 hours 09/05/23 2224     09/05/23 1945  piperacillin -tazobactam (ZOSYN ) IVPB 3.375 g  Status:  Discontinued        3.375 g 100 mL/hr over 30 Minutes Intravenous Every 6 hours 09/05/23 1939 09/05/23 2223      T bili and LFT down H&H stable WBC at 15   Assessment/Plan: Patient s/p robotic cholecystectomy ,JP drain placement for gangrenous cholecystitis with perforation. PRN morphine  Protonix IV NG in place, NPO with ice and limited oral med Labs tomorrow Zosyn  for 5 days post op can end 5/7 JP in place, monitor drainage    LOS: 3 days    Antonio Hughes 09/08/2023

## 2023-09-09 ENCOUNTER — Encounter (HOSPITAL_COMMUNITY): Payer: Self-pay | Admitting: General Surgery

## 2023-09-09 ENCOUNTER — Inpatient Hospital Stay (HOSPITAL_COMMUNITY)

## 2023-09-09 DIAGNOSIS — K810 Acute cholecystitis: Secondary | ICD-10-CM | POA: Diagnosis not present

## 2023-09-09 LAB — CBC WITH DIFFERENTIAL/PLATELET
Abs Immature Granulocytes: 0.06 10*3/uL (ref 0.00–0.07)
Basophils Absolute: 0 10*3/uL (ref 0.0–0.1)
Basophils Relative: 0 %
Eosinophils Absolute: 0 10*3/uL (ref 0.0–0.5)
Eosinophils Relative: 0 %
HCT: 32 % — ABNORMAL LOW (ref 39.0–52.0)
Hemoglobin: 11.5 g/dL — ABNORMAL LOW (ref 13.0–17.0)
Immature Granulocytes: 1 %
Lymphocytes Relative: 15 %
Lymphs Abs: 1.6 10*3/uL (ref 0.7–4.0)
MCH: 29.2 pg (ref 26.0–34.0)
MCHC: 35.9 g/dL (ref 30.0–36.0)
MCV: 81.2 fL (ref 80.0–100.0)
Monocytes Absolute: 0.7 10*3/uL (ref 0.1–1.0)
Monocytes Relative: 7 %
Neutro Abs: 8.2 10*3/uL — ABNORMAL HIGH (ref 1.7–7.7)
Neutrophils Relative %: 77 %
Platelets: 284 10*3/uL (ref 150–400)
RBC: 3.94 MIL/uL — ABNORMAL LOW (ref 4.22–5.81)
RDW: 14 % (ref 11.5–15.5)
WBC: 10.6 10*3/uL — ABNORMAL HIGH (ref 4.0–10.5)
nRBC: 0 % (ref 0.0–0.2)

## 2023-09-09 LAB — COMPREHENSIVE METABOLIC PANEL WITH GFR
ALT: 68 U/L — ABNORMAL HIGH (ref 0–44)
AST: 37 U/L (ref 15–41)
Albumin: 2.3 g/dL — ABNORMAL LOW (ref 3.5–5.0)
Alkaline Phosphatase: 76 U/L (ref 38–126)
Anion gap: 8 (ref 5–15)
BUN: 46 mg/dL — ABNORMAL HIGH (ref 8–23)
CO2: 28 mmol/L (ref 22–32)
Calcium: 8.8 mg/dL — ABNORMAL LOW (ref 8.9–10.3)
Chloride: 107 mmol/L (ref 98–111)
Creatinine, Ser: 1.21 mg/dL (ref 0.61–1.24)
GFR, Estimated: >60 mL/min (ref >60)
Glucose, Bld: 122 mg/dL — ABNORMAL HIGH (ref 70–99)
Potassium: 3.6 mmol/L (ref 3.5–5.1)
Sodium: 143 mmol/L (ref 135–145)
Total Bilirubin: 0.9 mg/dL (ref 0.0–1.2)
Total Protein: 6.2 g/dL — ABNORMAL LOW (ref 6.5–8.1)

## 2023-09-09 NOTE — Progress Notes (Signed)
 PROGRESS NOTE    Antonio Hughes  ZOX:[id number] DOB: 03-Nov-1945 DOA: 09/05/2023 PCP: Wyvonna Heidelberg, MD   Brief Narrative:    Antonio Hughes is a 78 y.o. male with medical history significant for hypertension, prostate cancer.  Patient presented to the ED with complaints of generalized weakness, poor oral intake, right upper quadrant pain over the past 4 days.  Patient admitted with sepsis secondary to acute cholecystitis with noted CBD dilation and MRCP does not show any signs of choledocholithiasis.  General surgery consulted and patient underwent robotic assisted laparoscopic cholecystectomy and was noted to have acute gangrenous cholecystitis with perforation on 5/2.  Assessment & Plan:   Principal Problem:   Acute gangrenous cholecystitis Active Problems:   Sepsis (HCC)   HTN (hypertension)   Prostate cancer (HCC)   Dilated cbd, acquired   Cholecystitis with perforation of gallbladder  Assessment and Plan:   Acute cholecystitis Meeting sepsis criteria with fever of 100.1, leukocytosis of 20.1.  T. bili elevated at 1.9 otherwise normal liver enzymes.  Normal lipase 28.  CT abdomen and pelvis with contrast- Pericholecystic fluid and the CBD dilatation. No intrahepatic biliary ductal dilatation. No hepatic parenchymal abnormalities.  Ultrasound evaluation recommended. - Status post robotic assisted laparoscopic cholecystectomy with noted acute gangrenous cholecystitis with perforation on 5/2 -Continue to monitor labs - Continue to maintain adequate hydration and follow general surgery recommendations for diet advancements and further postoperative care.   Sepsis (HCC) Sepsis criteria with leukocytosis of 20.1, fever of 100.1 at time of admission.  Likely secondary to acute cholecystitis and UTI  -Afebrile and WBC is trending down. -Continue current IV antibiotics - Related to above status post cholecystectomy -Blood cultures with no growth so far. -Urine  cultures with noted isolated E. coli species; sensitive to current antibiotics.  Prostate cancer Antonio Hughes) -Status post prostatectomy. -Continue outpatient follow-up with urology/oncology service. - Appears to be in remission.   HTN (hypertension) -Holding oral antihypertensive agents at the moment - Low-dose Lopressor IV has been started.  DVT prophylaxis: SCDs Code Status: Full Family Communication: spouse at bedside 5/5 Disposition Plan:  Status is: Inpatient Remains inpatient appropriate because: Need for IV medications.  Consultants:  General Surgery  Procedures:  Robotic assisted laparoscopic cholecystectomy 5/2  Antimicrobials:  Anti-infectives (From admission, onward)    Start     Dose/Rate Route Frequency Ordered Stop   09/06/23 1400  cefoTEtan  (CEFOTAN ) 2 g in sodium chloride  0.9 % 100 mL IVPB        2 g 200 mL/hr over 30 Minutes Intravenous On call to O.R. 09/06/23 1341 09/06/23 1510   09/06/23 0200  piperacillin -tazobactam (ZOSYN ) IVPB 3.375 g        3.375 g 12.5 mL/hr over 240 Minutes Intravenous Every 8 hours 09/05/23 2224     09/05/23 1945  piperacillin -tazobactam (ZOSYN ) IVPB 3.375 g  Status:  Discontinued        3.375 g 100 mL/hr over 30 Minutes Intravenous Every 6 hours 09/05/23 1939 09/05/23 2223      Subjective: Afebrile, no chest pain, reporting intermittent nausea but no further vomiting.  NG tube in place.  No bowel movements, but is passing flatus.  Objective: Vitals:   09/08/23 2256 09/09/23 0217 09/09/23 0355 09/09/23 0603  BP: (!) 143/89  126/82 139/79  Pulse: 77  66 72  Resp: 18 19    Temp:   97.8 F (36.6 C)   TempSrc:   Oral   SpO2: 98%  99% 97%  Weight:  Height:        Intake/Output Summary (Last 24 hours) at 09/09/2023 1118 Last data filed at 09/09/2023 0650 Gross per 24 hour  Intake --  Output 1615 ml  Net -1615 ml   Filed Weights   09/05/23 1619 09/05/23 2304 09/06/23 1417  Weight: 65.8 kg 59.8 kg 59.8 kg     Examination: General exam: Alert, awake, oriented x 3; no chest pain, still reporting mild intermittent nausea but no further vomiting.  NG tube in place. Respiratory system: Good saturation on room air. Cardiovascular system: Rate controlled, no rubs, no gallops, no JVD on exam. Gastrointestinal system: Abdomen is mildly distended and appropriately tender on palpation; no bowel sounds appreciated on exam.  JP drain in place (serosanguineous output appreciated). Central nervous system:No focal neurological deficits. Extremities: No cyanosis, clubbing or edema. Skin: No petechiae. Psychiatry: Judgement and insight appear normal.    Data Reviewed: I have personally reviewed following labs and imaging studies  CBC: Recent Labs  Lab 09/05/23 1646 09/06/23 0502 09/07/23 0428 09/08/23 0432 09/09/23 0421  WBC 20.1* 20.7* 15.3* 15.8* 10.6*  NEUTROABS 17.4*  --   --   --  8.2*  HGB 14.5 13.4 11.3* 12.9* 11.5*  HCT 41.0 37.6* 30.9* 35.5* 32.0*  MCV 79.2* 79.0* 79.8* 79.8* 81.2  PLT 239 207 219 316 284   Basic Metabolic Panel: Recent Labs  Lab 09/05/23 1646 09/06/23 0502 09/07/23 0428 09/08/23 0432 09/09/23 0421  NA 135 135 138 139 143  K 3.9 3.6 4.1 3.8 3.6  CL 98 100 101 103 107  CO2 26 25 26 26 28   GLUCOSE 179* 173* 274* 186* 122*  BUN 31* 30* 52* 58* 46*  CREATININE 1.17 1.10 1.80* 1.73* 1.21  CALCIUM 10.0 9.6 8.8* 9.3 8.8*  MG  --   --  2.3 2.8*  --    GFR: Estimated Creatinine Clearance: 43.2 mL/min (by C-G formula based on SCr of 1.21 mg/dL).  Liver Function Tests: Recent Labs  Lab 09/05/23 1646 09/06/23 0502 09/07/23 0428 09/08/23 0432 09/09/23 0421  AST 39 33 144* 61* 37  ALT 46* 44 123* 98* 68*  ALKPHOS 74 78 81 88 76  BILITOT 1.9* 1.9* 2.0* 1.2 0.9  PROT 8.0 7.4 6.8 7.2 6.2*  ALBUMIN 3.7 3.2* 2.6* 2.7* 2.3*   Recent Labs  Lab 09/05/23 1646  LIPASE 28   CBG: Recent Labs  Lab 09/05/23 1615  GLUCAP 199*   Sepsis Labs: Recent Labs  Lab  09/05/23 2240  LATICACIDVEN 1.2    Recent Results (from the past 240 hours)  Urine Culture (for pregnant, neutropenic or urologic patients or patients with an indwelling urinary catheter)     Status: Abnormal   Collection Time: 09/05/23  7:45 PM   Specimen: Urine, Clean Catch  Result Value Ref Range Status   Specimen Description   Final    URINE, CLEAN CATCH Performed at [facility], [address], [place], [place] [address]    Special Requests   Final    NONE Performed at [facility], [address], [place], [place] [address]    Culture 60,000 COLONIES/mL KLEBSIELLA PNEUMONIAE (A)  Final   Report Status 09/08/2023 FINAL  Final   Organism ID, Bacteria KLEBSIELLA PNEUMONIAE (A)  Final      Susceptibility   Klebsiella pneumoniae - MIC*    AMPICILLIN >=32 RESISTANT Resistant     CEFAZOLIN  <=4 SENSITIVE Sensitive     CEFEPIME <=0.12 SENSITIVE Sensitive     CEFTRIAXONE <=0.25  SENSITIVE Sensitive     CIPROFLOXACIN <=0.25 SENSITIVE Sensitive     GENTAMICIN <=1 SENSITIVE Sensitive     IMIPENEM <=0.25 SENSITIVE Sensitive     NITROFURANTOIN <=16 SENSITIVE Sensitive     TRIMETH /SULFA  <=20 SENSITIVE Sensitive     AMPICILLIN/SULBACTAM 16 INTERMEDIATE Intermediate     PIP/TAZO <=4 SENSITIVE Sensitive ug/mL    * 60,000 COLONIES/mL KLEBSIELLA PNEUMONIAE  Culture, blood (Routine X 2) w Reflex to ID Panel     Status: None (Preliminary result)   Collection Time: 09/05/23 10:39 PM   Specimen: BLOOD  Result Value Ref Range Status   Specimen Description BLOOD BLOOD LEFT ARM  Final   Special Requests   Final    BOTTLES DRAWN AEROBIC AND ANAEROBIC Blood Culture adequate volume   Culture   Final    NO GROWTH 4 DAYS Performed at [facility], [address] [place], [place] [address]    Report Status PENDING  Incomplete  Culture, blood (Routine X 2) w Reflex to ID Panel     Status: None (Preliminary result)   Collection Time: 09/05/23 10:40 PM   Specimen: BLOOD  Result Value Ref Range  Status   Specimen Description BLOOD BLOOD LEFT HAND  Final   Special Requests   Final    BOTTLES DRAWN AEROBIC AND ANAEROBIC Blood Culture adequate volume   Culture   Final    NO GROWTH 4 DAYS Performed at [facility], [address] [place], [place] [address]    Report Status PENDING  Incomplete    Radiology Studies: DG Chest Port 1 View Result Date: 09/07/2023 CLINICAL DATA:  Status post nasogastric tube placement. Status post cholecystectomy yesterday. EXAM: PORTABLE CHEST 1 VIEW COMPARISON:  10/27/2022 FINDINGS: Borderline enlarged heart. Interval small amount of patchy and linear density at the left lung base and small amount of linear density at the right lung base interval nasogastric tube extending into the stomach with its tip and side hole in the proximal to mid stomach. Lumbar spine degenerative changes and mild scoliosis. Right mid to upper abdomen surgical drain. Mildly dilated gas-filled small bowel and colon, compatible with postoperative ileus. IMPRESSION: 1. Interval nasogastric tube extending into the stomach with its tip and side hole in the proximal to mid stomach. 2. Interval small amount of bibasilar atelectasis. 3. Mild postoperative ileus. Electronically Signed   By: Catherin Closs M.D.   On: 09/07/2023 17:10   Scheduled Meds:  LORazepam   0.5 mg Intravenous Once   metoprolol tartrate  2.5 mg Intravenous Q8H   pantoprazole (PROTONIX) IV  40 mg Intravenous Q24H   Continuous Infusions:  lactated ringers  75 mL/hr at 09/09/23 0602   piperacillin -tazobactam (ZOSYN )  IV 3.375 g (09/09/23 0816)   promethazine (PHENERGAN) injection (IM or IVPB) Stopped (09/07/23 1616)     LOS: 4 days    Time spent: 50 minutes    Antonio Viscomi Loran Rock, DO [facility]  If 7PM-7AM, please contact night-coverage [email or web address] 09/09/2023, 11:18 AM

## 2023-09-09 NOTE — Progress Notes (Signed)
 [facility]  Doing well. JP with SS output. Having flatus but no BM.   LFTs down.  BP (!) 140/76 (BP Location: Left Arm)   Pulse 70   Temp 97.8 F (36.6 C) (Oral)   Resp 19   Ht 5\' 6"  (1.676 m)   Wt 59.8 kg   SpO2 100%   BMI 21.28 kg/m  Soft, mildly distended, staple s/d/I JP SS output   Patient s/p robotic cholecystectomy ,JP drain placement for gangrenous cholecystitis with perforation. PRN morphine  Protonix IV NG in place, NPO with ice and limited oral med Labs all looking good  Zosyn  for 5 days post op can end 5/7 JP in place, monitor drainage   Deena Farrier, MD [facility] [address] [place], [place] [address] [phone] (office)

## 2023-09-10 DIAGNOSIS — K810 Acute cholecystitis: Secondary | ICD-10-CM | POA: Diagnosis not present

## 2023-09-10 LAB — COMPREHENSIVE METABOLIC PANEL WITH GFR
ALT: 85 U/L — ABNORMAL HIGH (ref 0–44)
AST: 63 U/L — ABNORMAL HIGH (ref 15–41)
Albumin: 2.4 g/dL — ABNORMAL LOW (ref 3.5–5.0)
Alkaline Phosphatase: 82 U/L (ref 38–126)
Anion gap: 7 (ref 5–15)
BUN: 35 mg/dL — ABNORMAL HIGH (ref 8–23)
CO2: 26 mmol/L (ref 22–32)
Calcium: 9 mg/dL (ref 8.9–10.3)
Chloride: 111 mmol/L (ref 98–111)
Creatinine, Ser: 1.06 mg/dL (ref 0.61–1.24)
GFR, Estimated: >60 mL/min (ref >60)
Glucose, Bld: 97 mg/dL (ref 70–99)
Potassium: 3.6 mmol/L (ref 3.5–5.1)
Sodium: 144 mmol/L (ref 135–145)
Total Bilirubin: 1.9 mg/dL — ABNORMAL HIGH (ref 0.0–1.2)
Total Protein: 6.2 g/dL — ABNORMAL LOW (ref 6.5–8.1)

## 2023-09-10 LAB — CULTURE, BLOOD (ROUTINE X 2)
Culture: NO GROWTH
Culture: NO GROWTH
Special Requests: ADEQUATE
Special Requests: ADEQUATE

## 2023-09-10 LAB — CBC
HCT: 32.6 % — ABNORMAL LOW (ref 39.0–52.0)
Hemoglobin: 11.3 g/dL — ABNORMAL LOW (ref 13.0–17.0)
MCH: 28.4 pg (ref 26.0–34.0)
MCHC: 34.7 g/dL (ref 30.0–36.0)
MCV: 81.9 fL (ref 80.0–100.0)
Platelets: 295 10*3/uL (ref 150–400)
RBC: 3.98 MIL/uL — ABNORMAL LOW (ref 4.22–5.81)
RDW: 13.9 % (ref 11.5–15.5)
WBC: 10.7 10*3/uL — ABNORMAL HIGH (ref 4.0–10.5)
nRBC: 0 % (ref 0.0–0.2)

## 2023-09-10 LAB — SURGICAL PATHOLOGY

## 2023-09-10 LAB — MAGNESIUM: Magnesium: 2.2 mg/dL (ref 1.7–2.4)

## 2023-09-10 MED ORDER — MORPHINE SULFATE (PF) 2 MG/ML IV SOLN: 2.0000 mg | INTRAVENOUS | Status: DC | PRN

## 2023-09-10 MED ORDER — DOCUSATE SODIUM 100 MG PO CAPS
100.0000 mg | ORAL_CAPSULE | Freq: Two times a day (BID) | ORAL | Status: DC
  Administered 2023-09-10 – 2023-09-12 (×5): 100 mg via ORAL
  Filled 2023-09-10 (×5): qty 1

## 2023-09-10 MED ORDER — OXYCODONE HCL 5 MG PO TABS: 5.0000 mg | ORAL_TABLET | ORAL | Status: DC | PRN

## 2023-09-10 MED ORDER — BOOST / RESOURCE BREEZE PO LIQD CUSTOM
1.0000 | Freq: Three times a day (TID) | ORAL | Status: DC
Start: 2023-09-11 — End: 2023-09-12
  Administered 2023-09-11 – 2023-09-12 (×5): 1 via ORAL

## 2023-09-10 NOTE — Progress Notes (Signed)
 PROGRESS NOTE    Antonio Hughes  BJY:[id number] DOB: 11/11/1945 DOA: 09/05/2023 PCP: Wyvonna Heidelberg, MD   Brief Narrative:    Antonio Hughes is a 78 y.o. male with medical history significant for hypertension, prostate cancer.  Patient presented to the ED with complaints of generalized weakness, poor oral intake, right upper quadrant pain over the past 4 days.  Patient admitted with sepsis secondary to acute cholecystitis with noted CBD dilation and MRCP does not show any signs of choledocholithiasis.  General surgery consulted and patient underwent robotic assisted laparoscopic cholecystectomy and was noted to have acute gangrenous cholecystitis with perforation on 5/2.  Dietary advancement to clear liquids per general surgery and NG tube removed on 5/6.  Continues to require IV Zosyn  through 5/7.  Anticipate discharge in the next 48 hours.  Assessment & Plan:   Principal Problem:   Acute gangrenous cholecystitis Active Problems:   Sepsis (HCC)   HTN (hypertension)   Prostate cancer (HCC)   Dilated cbd, acquired   Cholecystitis with perforation of gallbladder  Assessment and Plan:   Acute cholecystitis status post laparoscopic cholecystectomy - Status post robotic assisted laparoscopic cholecystectomy with noted acute gangrenous cholecystitis with perforation on 5/2 -Continue to monitor labs - Okay to discontinue IV fluid is advance to clear liquid diet, advance further per general surgery recommendations -NG tube removed 5/6   Sepsis (HCC)-resolved Sepsis criteria with leukocytosis of 20.1, fever of 100.1 at time of admission.  Likely secondary to acute cholecystitis and UTI  -Afebrile and WBC is trending down. -Continue current IV antibiotics with Zosyn  through 5/7 - Related to above status post cholecystectomy -Blood cultures with no growth so far. -Urine cultures with noted isolated E. coli species; sensitive to current antibiotics.  Prostate cancer  [facility]) -Status post prostatectomy. -Continue outpatient follow-up with urology/oncology service. - Appears to be in remission.   HTN (hypertension) -Holding oral antihypertensive agents at the moment - Low-dose Lopressor IV has been started.  DVT prophylaxis: SCDs Code Status: Full Family Communication: spouse at bedside 5/6 Disposition Plan:  Status is: Inpatient Remains inpatient appropriate because: Need for IV medications.  Consultants:  General Surgery  Procedures:  Robotic assisted laparoscopic cholecystectomy 5/2  Antimicrobials:  Anti-infectives (From admission, onward)    Start     Dose/Rate Route Frequency Ordered Stop   09/06/23 1400  cefoTEtan  (CEFOTAN ) 2 g in sodium chloride  0.9 % 100 mL IVPB        2 g 200 mL/hr over 30 Minutes Intravenous On call to O.R. 09/06/23 1341 09/06/23 1510   09/06/23 0200  piperacillin -tazobactam (ZOSYN ) IVPB 3.375 g        3.375 g 12.5 mL/hr over 240 Minutes Intravenous Every 8 hours 09/05/23 2224 09/11/23 2359   09/05/23 1945  piperacillin -tazobactam (ZOSYN ) IVPB 3.375 g  Status:  Discontinued        3.375 g 100 mL/hr over 30 Minutes Intravenous Every 6 hours 09/05/23 1939 09/05/23 2223      Subjective: Patient seen and evaluated and overall feeling well and denies any complaints or concerns.  He has had a bowel movement last night and is passing flatus.  Objective: Vitals:   09/09/23 1217 09/09/23 2007 09/09/23 2258 09/10/23 0443  BP: (!) 140/76 (!) 149/80 (!) 150/79 (!) 151/80  Pulse: 70 75 79 71  Resp:      Temp:  98.6 F (37 C)  98.7 F (37.1 C)  TempSrc:  Oral  Oral  SpO2: 100% 95%  100%  Weight:      Height:        Intake/Output Summary (Last 24 hours) at 09/10/2023 1057 Last data filed at 09/10/2023 0335 Gross per 24 hour  Intake 0 ml  Output 575 ml  Net -575 ml   Filed Weights   09/05/23 1619 09/05/23 2304 09/06/23 1417  Weight: 65.8 kg 59.8 kg 59.8 kg    Examination: General exam: Alert, awake,  oriented x 3 Respiratory system: Good saturation on room air. Cardiovascular system: Rate controlled, no rubs, no gallops, no JVD on exam. Gastrointestinal system: Abdomen is mildly distended and appropriately tender on palpation; no bowel sounds appreciated on exam.  JP drain in place (serosanguineous output appreciated). Central nervous system:No focal neurological deficits. Extremities: No cyanosis, clubbing or edema. Skin: No petechiae. Psychiatry: Judgement and insight appear normal.    Data Reviewed: I have personally reviewed following labs and imaging studies  CBC: Recent Labs  Lab 09/05/23 1646 09/06/23 0502 09/07/23 0428 09/08/23 0432 09/09/23 0421 09/10/23 0551  WBC 20.1* 20.7* 15.3* 15.8* 10.6* 10.7*  NEUTROABS 17.4*  --   --   --  8.2*  --   HGB 14.5 13.4 11.3* 12.9* 11.5* 11.3*  HCT 41.0 37.6* 30.9* 35.5* 32.0* 32.6*  MCV 79.2* 79.0* 79.8* 79.8* 81.2 81.9  PLT 239 207 219 316 284 295   Basic Metabolic Panel: Recent Labs  Lab 09/06/23 0502 09/07/23 0428 09/08/23 0432 09/09/23 0421 09/10/23 0551  NA 135 138 139 143 144  K 3.6 4.1 3.8 3.6 3.6  CL 100 101 103 107 111  CO2 25 26 26 28 26   GLUCOSE 173* 274* 186* 122* 97  BUN 30* 52* 58* 46* 35*  CREATININE 1.10 1.80* 1.73* 1.21 1.06  CALCIUM 9.6 8.8* 9.3 8.8* 9.0  MG  --  2.3 2.8*  --  2.2   GFR: Estimated Creatinine Clearance: 49.4 mL/min (by C-G formula based on SCr of 1.06 mg/dL).  Liver Function Tests: Recent Labs  Lab 09/06/23 0502 09/07/23 0428 09/08/23 0432 09/09/23 0421 09/10/23 0551  AST 33 144* 61* 37 63*  ALT 44 123* 98* 68* 85*  ALKPHOS 78 81 88 76 82  BILITOT 1.9* 2.0* 1.2 0.9 1.9*  PROT 7.4 6.8 7.2 6.2* 6.2*  ALBUMIN 3.2* 2.6* 2.7* 2.3* 2.4*   Recent Labs  Lab 09/05/23 1646  LIPASE 28   CBG: Recent Labs  Lab 09/05/23 1615  GLUCAP 199*   Sepsis Labs: Recent Labs  Lab 09/05/23 2240  LATICACIDVEN 1.2    Recent Results (from the past 240 hours)  Urine Culture (for  pregnant, neutropenic or urologic patients or patients with an indwelling urinary catheter)     Status: Abnormal   Collection Time: 09/05/23  7:45 PM   Specimen: Urine, Clean Catch  Result Value Ref Range Status   Specimen Description   Final    URINE, CLEAN CATCH Performed at [facility], [address] [place], [place] [address]    Special Requests   Final    NONE Performed at [facility], [address] [place], [place] [address]    Culture 60,000 COLONIES/mL KLEBSIELLA PNEUMONIAE (A)  Final   Report Status 09/08/2023 FINAL  Final   Organism ID, Bacteria KLEBSIELLA PNEUMONIAE (A)  Final      Susceptibility   Klebsiella pneumoniae - MIC*    AMPICILLIN >=32 RESISTANT Resistant     CEFAZOLIN  <=4 SENSITIVE Sensitive     CEFEPIME <=0.12 SENSITIVE Sensitive     CEFTRIAXONE <=0.25 SENSITIVE Sensitive  CIPROFLOXACIN <=0.25 SENSITIVE Sensitive     GENTAMICIN <=1 SENSITIVE Sensitive     IMIPENEM <=0.25 SENSITIVE Sensitive     NITROFURANTOIN <=16 SENSITIVE Sensitive     TRIMETH /SULFA  <=20 SENSITIVE Sensitive     AMPICILLIN/SULBACTAM 16 INTERMEDIATE Intermediate     PIP/TAZO <=4 SENSITIVE Sensitive ug/mL    * 60,000 COLONIES/mL KLEBSIELLA PNEUMONIAE  Culture, blood (Routine X 2) w Reflex to ID Panel     Status: None   Collection Time: 09/05/23 10:39 PM   Specimen: BLOOD  Result Value Ref Range Status   Specimen Description BLOOD BLOOD LEFT ARM  Final   Special Requests   Final    BOTTLES DRAWN AEROBIC AND ANAEROBIC Blood Culture adequate volume   Culture   Final    NO GROWTH 5 DAYS Performed at [facility], [address], [place], [place] [address]    Report Status 09/10/2023 FINAL  Final  Culture, blood (Routine X 2) w Reflex to ID Panel     Status: None   Collection Time: 09/05/23 10:40 PM   Specimen: BLOOD  Result Value Ref Range Status   Specimen Description BLOOD BLOOD LEFT HAND  Final   Special Requests   Final    BOTTLES DRAWN AEROBIC AND ANAEROBIC Blood Culture  adequate volume   Culture   Final    NO GROWTH 5 DAYS Performed at [facility], [address], [place], [place] [address]    Report Status 09/10/2023 FINAL  Final    Radiology Studies: DG Abd 1 View Result Date: 09/09/2023 CLINICAL DATA:  Check gastric catheter placement EXAM: ABDOMEN - 1 VIEW COMPARISON:  None Available. FINDINGS: Gastric catheter is noted coiled within the stomach. Scattered mildly dilated small bowel loops are again seen. IMPRESSION: Gastric catheter within the stomach. Electronically Signed   By: Violeta Grey M.D.   On: 09/09/2023 20:42   Scheduled Meds:  docusate sodium   100 mg Oral BID   LORazepam   0.5 mg Intravenous Once   metoprolol tartrate  2.5 mg Intravenous Q8H   pantoprazole (PROTONIX) IV  40 mg Intravenous Q24H   Continuous Infusions:  piperacillin -tazobactam (ZOSYN )  IV 3.375 g (09/10/23 0824)   promethazine (PHENERGAN) injection (IM or IVPB) Stopped (09/07/23 1616)     LOS: 5 days    Time spent: 50 minutes    Eren Puebla Loran Rock, DO [facility]  If 7PM-7AM, please contact night-coverage [email or web address] 09/10/2023, 10:57 AM

## 2023-09-10 NOTE — Plan of Care (Signed)
 NG was out approx 6cm from marking at shift change. Advanced NG and X-ray repeated for placement confirmation. NG restarted to ILWS but only had 25ml OP. Was able verify placement by auscultation.   JP output 20ml  No c/o pain or nausea this far into the shift. Pt had a medium liquidly with some small mucus like stool per rectum this morning.   Problem: Education: Goal: Knowledge of General Education information will improve Description: Including pain rating scale, medication(s)/side effects and non-pharmacologic comfort measures Outcome: Progressing   Problem: Health Behavior/Discharge Planning: Goal: Ability to manage health-related needs will improve Outcome: Progressing   Problem: Clinical Measurements: Goal: Ability to maintain clinical measurements within normal limits will improve Outcome: Progressing Goal: Will remain free from infection Outcome: Progressing Goal: Diagnostic test results will improve Outcome: Progressing   Problem: Activity: Goal: Risk for activity intolerance will decrease Outcome: Progressing   Problem: Elimination: Goal: Will not experience complications related to bowel motility Outcome: Progressing Goal: Will not experience complications related to urinary retention Outcome: Progressing   Problem: Pain Managment: Goal: General experience of comfort will improve and/or be controlled Outcome: Progressing   Problem: Safety: Goal: Ability to remain free from injury will improve Outcome: Progressing

## 2023-09-10 NOTE — Progress Notes (Signed)
 Mobility Specialist Progress Note:    09/10/23 0940  Mobility  Activity Ambulated with assistance in hallway  Level of Assistance Minimal assist, patient does 75% or more  Assistive Device Front wheel walker  Distance Ambulated (ft) 100 ft  Range of Motion/Exercises Active;All extremities  Activity Response Tolerated well  Mobility Referral Yes  Mobility visit 1 Mobility  Mobility Specialist Start Time (ACUTE ONLY) 0920  Mobility Specialist Stop Time (ACUTE ONLY) 0940  Mobility Specialist Time Calculation (min) (ACUTE ONLY) 20 min   Pt received in bed, wife in room. Agreeable to mobility, required MinA to stand and CGA to ambulate with RW. Tolerated well, asx throughout. Returned to room, left pt in bed. All needs met.   Glinda Lapping Mobility Specialist Please contact via Special educational needs teacher or  Rehab office at [phone]

## 2023-09-10 NOTE — Progress Notes (Addendum)
 [facility]  Bm last night and reporting flatus. Says he is feeling better. Lfts obtained with some slight increase, likely reactive from post operative inflammation.  BP (!) 151/80 (BP Location: Left Arm)   Pulse 71   Temp 98.7 F (37.1 C) (Oral)   Resp 19   Ht 5\' 6"  (1.676 m)   Wt 59.8 kg   SpO2 100%   BMI 21.28 kg/m   Soft, less distended, port sites c/d/I JP SS output  Patient s/p robotic cholecystectomy ,JP drain placement for gangrenous cholecystitis with perforation. PRN morphine  added roxicodone back  Protonix IV Clear diet go slow NG removed  Colace added  Lfts likely reactive  Zosyn  for 5 days post op can end 5/7 JP in place, monitor drainage  Hopefully eating and able to go home in next 48 hours   Deena Farrier, MD [facility] [address] [place]low [place], [place] [address] [phone] (office)

## 2023-09-11 DIAGNOSIS — K810 Acute cholecystitis: Secondary | ICD-10-CM | POA: Diagnosis not present

## 2023-09-11 DIAGNOSIS — D72829 Elevated white blood cell count, unspecified: Secondary | ICD-10-CM

## 2023-09-11 DIAGNOSIS — I10 Essential (primary) hypertension: Secondary | ICD-10-CM | POA: Diagnosis not present

## 2023-09-11 DIAGNOSIS — A419 Sepsis, unspecified organism: Secondary | ICD-10-CM | POA: Diagnosis not present

## 2023-09-11 LAB — CBC
HCT: 31.5 % — ABNORMAL LOW (ref 39.0–52.0)
Hemoglobin: 11 g/dL — ABNORMAL LOW (ref 13.0–17.0)
MCH: 29 pg (ref 26.0–34.0)
MCHC: 34.9 g/dL (ref 30.0–36.0)
MCV: 83.1 fL (ref 80.0–100.0)
Platelets: 328 10*3/uL (ref 150–400)
RBC: 3.79 MIL/uL — ABNORMAL LOW (ref 4.22–5.81)
RDW: 14.1 % (ref 11.5–15.5)
WBC: 10.8 10*3/uL — ABNORMAL HIGH (ref 4.0–10.5)
nRBC: 0 % (ref 0.0–0.2)

## 2023-09-11 LAB — COMPREHENSIVE METABOLIC PANEL WITH GFR
ALT: 104 U/L — ABNORMAL HIGH (ref 0–44)
AST: 74 U/L — ABNORMAL HIGH (ref 15–41)
Albumin: 2.3 g/dL — ABNORMAL LOW (ref 3.5–5.0)
Alkaline Phosphatase: 88 U/L (ref 38–126)
Anion gap: 7 (ref 5–15)
BUN: 26 mg/dL — ABNORMAL HIGH (ref 8–23)
CO2: 26 mmol/L (ref 22–32)
Calcium: 8.6 mg/dL — ABNORMAL LOW (ref 8.9–10.3)
Chloride: 108 mmol/L (ref 98–111)
Creatinine, Ser: 1.01 mg/dL (ref 0.61–1.24)
GFR, Estimated: >60 mL/min (ref >60)
Glucose, Bld: 111 mg/dL — ABNORMAL HIGH (ref 70–99)
Potassium: 3.6 mmol/L (ref 3.5–5.1)
Sodium: 141 mmol/L (ref 135–145)
Total Bilirubin: 1.1 mg/dL (ref 0.0–1.2)
Total Protein: 5.9 g/dL — ABNORMAL LOW (ref 6.5–8.1)

## 2023-09-11 LAB — MAGNESIUM: Magnesium: 2.1 mg/dL (ref 1.7–2.4)

## 2023-09-11 NOTE — Progress Notes (Signed)
 PROGRESS NOTE   Antonio Hughes  ZOX:[id number] DOB: 1945-07-01 DOA: 09/05/2023 PCP: Wyvonna Heidelberg, MD   Chief Complaint  Patient presents with   Weakness   Level of care: Med-Surg  Brief Admission History:  78 y.o. male with medical history significant for hypertension, prostate cancer.  Patient presented to the ED with complaints of generalized weakness, poor oral intake, right upper quadrant pain over the past 4 days.  Patient admitted with sepsis secondary to acute cholecystitis with noted CBD dilation and MRCP does not show any signs of choledocholithiasis.  General surgery consulted and patient underwent robotic assisted laparoscopic cholecystectomy and was noted to have acute gangrenous cholecystitis with perforation on 5/2.  Dietary advancement to clear liquids per general surgery and NG tube removed on 5/6.  Continues to require IV Zosyn  through 5/7.  Anticipate discharge in the next 48 hours.    Assessment and Plan:  Acute gangrenous cholecystitis status post robotic laparoscopic cholecystectomy - Status post robotic assisted laparoscopic cholecystectomy with noted acute gangrenous cholecystitis with perforation on 5/2 - clinically improving daily  - advancing diet  - NG tube removed 5/6 - discussed with surgeon, anticipating DC tomorrow 09/12/23    Sepsis-resolved  -Afebrile now. -Completing IV Zosyn  through 5/7 -Blood cultures with no growth to date  -Urine cultures with noted isolated E. coli species; sensitive to current antibiotics.   Prostate cancer -Status post prostatectomy. -Continue outpatient follow-up with urology/oncology service. -Appears to be in remission.   HTN (hypertension) -Holding oral antihypertensive agents at the moment -Low-dose Lopressor IV has been started.  DVT prophylaxis: SCDs Code Status: Full  Family Communication: spouse 5/7 Disposition: home tomorrow    Consultants:  surgeon Procedures:  Robotic assisted  laparoscopic cholecystectomy 5/2   Antimicrobials:  Zosyn  5/2-5/7   Subjective: No particular complaints, says he is feeling better, tolerating diet better.   Objective: Vitals:   09/10/23 0443 09/10/23 1300 09/10/23 2056 09/11/23 0424  BP: (!) 151/80 (!) 146/81 124/70 132/68  Pulse: 71 73 61 (!) 58  Resp:  (!) 21 18 16   Temp: 98.7 F (37.1 C)  98.7 F (37.1 C) 99.2 F (37.3 C)  TempSrc: Oral  Oral Oral  SpO2: 100% 99% 100% 100%  Weight:      Height:        Intake/Output Summary (Last 24 hours) at 09/11/2023 0941 Last data filed at 09/10/2023 2000 Gross per 24 hour  Intake --  Output 40 ml  Net -40 ml   Filed Weights   09/05/23 1619 09/05/23 2304 09/06/23 1417  Weight: 65.8 kg 59.8 kg 59.8 kg   Examination:  General exam: Appears calm and comfortable  Respiratory system: Clear to auscultation. Respiratory effort normal. Cardiovascular system: normal S1 & S2 heard. No JVD, murmurs, rubs, gallops or clicks. No pedal edema. Gastrointestinal system: Abdomen is nondistended, soft and nontender. No organomegaly or masses felt. Bandages c/d/I. Normal bowel sounds heard. Central nervous system: Alert and oriented. No focal neurological deficits. Extremities: Symmetric 5 x 5 power. Skin: No rashes, lesions or ulcers. Psychiatry: Judgement and insight appear normal. Mood & affect appropriate.   Data Reviewed: I have personally reviewed following labs and imaging studies  CBC: Recent Labs  Lab 09/05/23 1646 09/06/23 0502 09/07/23 0428 09/08/23 0432 09/09/23 0421 09/10/23 0551 09/11/23 0353  WBC 20.1*   < > 15.3* 15.8* 10.6* 10.7* 10.8*  NEUTROABS 17.4*  --   --   --  8.2*  --   --   HGB 14.5   < >  11.3* 12.9* 11.5* 11.3* 11.0*  HCT 41.0   < > 30.9* 35.5* 32.0* 32.6* 31.5*  MCV 79.2*   < > 79.8* 79.8* 81.2 81.9 83.1  PLT 239   < > 219 316 284 295 328   < > = values in this interval not displayed.    Basic Metabolic Panel: Recent Labs  Lab 09/07/23 0428  09/08/23 0432 09/09/23 0421 09/10/23 0551 09/11/23 0353  NA 138 139 143 144 141  K 4.1 3.8 3.6 3.6 3.6  CL 101 103 107 111 108  CO2 26 26 28 26 26   GLUCOSE 274* 186* 122* 97 111*  BUN 52* 58* 46* 35* 26*  CREATININE 1.80* 1.73* 1.21 1.06 1.01  CALCIUM 8.8* 9.3 8.8* 9.0 8.6*  MG 2.3 2.8*  --  2.2 2.1    CBG: Recent Labs  Lab 09/05/23 1615  GLUCAP 199*    Recent Results (from the past 240 hours)  Urine Culture (for pregnant, neutropenic or urologic patients or patients with an indwelling urinary catheter)     Status: Abnormal   Collection Time: 09/05/23  7:45 PM   Specimen: Urine, Clean Catch  Result Value Ref Range Status   Specimen Description   Final    URINE, CLEAN CATCH Performed at [facility], [address] [place], [place] [address]    Special Requests   Final    NONE Performed at [facility], [address] [place], [place] [address]    Culture 60,000 COLONIES/mL KLEBSIELLA PNEUMONIAE (A)  Final   Report Status 09/08/2023 FINAL  Final   Organism ID, Bacteria KLEBSIELLA PNEUMONIAE (A)  Final      Susceptibility   Klebsiella pneumoniae - MIC*    AMPICILLIN >=32 RESISTANT Resistant     CEFAZOLIN  <=4 SENSITIVE Sensitive     CEFEPIME <=0.12 SENSITIVE Sensitive     CEFTRIAXONE <=0.25 SENSITIVE Sensitive     CIPROFLOXACIN <=0.25 SENSITIVE Sensitive     GENTAMICIN <=1 SENSITIVE Sensitive     IMIPENEM <=0.25 SENSITIVE Sensitive     NITROFURANTOIN <=16 SENSITIVE Sensitive     TRIMETH /SULFA  <=20 SENSITIVE Sensitive     AMPICILLIN/SULBACTAM 16 INTERMEDIATE Intermediate     PIP/TAZO <=4 SENSITIVE Sensitive ug/mL    * 60,000 COLONIES/mL KLEBSIELLA PNEUMONIAE  Culture, blood (Routine X 2) w Reflex to ID Panel     Status: None   Collection Time: 09/05/23 10:39 PM   Specimen: BLOOD  Result Value Ref Range Status   Specimen Description BLOOD BLOOD LEFT ARM  Final   Special Requests   Final    BOTTLES DRAWN AEROBIC AND ANAEROBIC Blood Culture adequate volume    Culture   Final    NO GROWTH 5 DAYS Performed at [facility], [address] [place], [place] [address]    Report Status 09/10/2023 FINAL  Final  Culture, blood (Routine X 2) w Reflex to ID Panel     Status: None   Collection Time: 09/05/23 10:40 PM   Specimen: BLOOD  Result Value Ref Range Status   Specimen Description BLOOD BLOOD LEFT HAND  Final   Special Requests   Final    BOTTLES DRAWN AEROBIC AND ANAEROBIC Blood Culture adequate volume   Culture   Final    NO GROWTH 5 DAYS Performed at [facility], [address], [place], [place] [address]    Report Status 09/10/2023 FINAL  Final     Radiology Studies: DG Abd 1 View Result Date: 09/09/2023 CLINICAL DATA:  Check gastric catheter placement  EXAM: ABDOMEN - 1 VIEW COMPARISON:  None Available. FINDINGS: Gastric catheter is noted coiled within the stomach. Scattered mildly dilated small bowel loops are again seen. IMPRESSION: Gastric catheter within the stomach. Electronically Signed   By: Violeta Grey M.D.   On: 09/09/2023 20:42    Scheduled Meds:  docusate sodium   100 mg Oral BID   feeding supplement  1 Container Oral TID BM   LORazepam   0.5 mg Intravenous Once   metoprolol tartrate  2.5 mg Intravenous Q8H   pantoprazole (PROTONIX) IV  40 mg Intravenous Q24H   Continuous Infusions:  piperacillin -tazobactam (ZOSYN )  IV 3.375 g (09/11/23 0901)   promethazine (PHENERGAN) injection (IM or IVPB) Stopped (09/07/23 1616)     LOS: 6 days   Time spent: 55 mins  Enaya Howze Lincoln Renshaw, MD How to contact the [facility] Attending or Consulting provider 7A - 7P or covering provider during after hours 7P -7A, for this patient?  Check the care team in [facility] and look for a) attending/consulting TRH provider listed and b) the TRH team listed Log into [email or web address] to find provider on call.  Locate the TRH provider you are looking for under [facility] and page to a number that you can be directly reached. If you still have difficulty reaching  the provider, please page the [facility] (Director on Call) for the Hospitalists listed on amion for assistance.  09/11/2023, 9:41 AM

## 2023-09-11 NOTE — Plan of Care (Signed)

## 2023-09-11 NOTE — Progress Notes (Signed)
 Mobility Specialist Progress Note:    09/11/23 1500  Mobility  Activity Ambulated with assistance in hallway;Transferred to/from BSC;Stood at bedside  Level of Assistance Contact guard assist, steadying assist  Assistive Device Front wheel walker  Distance Ambulated (ft) 120 ft  Range of Motion/Exercises Active;All extremities  Activity Response Tolerated well  Mobility Referral Yes  Mobility visit 1 Mobility  Mobility Specialist Start Time (ACUTE ONLY) 1410  Mobility Specialist Stop Time (ACUTE ONLY) 1440  Mobility Specialist Time Calculation (min) (ACUTE ONLY) 30 min   Pt received in bed, agreeable to mobility. Required CGA to stand and SBA to ambulate with RW. Tolerated well, asx throughout. Returned to room, left pt supine. All needs met.  Glinda Lapping Mobility Specialist Please contact via Special educational needs teacher or  Rehab office at [phone]

## 2023-09-11 NOTE — Hospital Course (Addendum)
 78 y.o. male with medical history significant for hypertension, prostate cancer.  Patient presented to the ED with complaints of generalized weakness, poor oral intake, right upper quadrant pain over the past 4 days.  Patient admitted with sepsis secondary to acute cholecystitis with noted CBD dilation and MRCP does not show any signs of choledocholithiasis.  General surgery consulted and patient underwent robotic assisted laparoscopic cholecystectomy and was noted to have acute gangrenous cholecystitis with perforation on 5/2.  Dietary advancement to clear liquids per general surgery and NG tube removed on 5/6.  Continues to require IV Zosyn  through 5/7.  Anticipate discharge in the next 48 hours.

## 2023-09-11 NOTE — Progress Notes (Addendum)
 [facility]  Patient doing much better. Had Bms and flatus. T bili back down today. Tolerated clears.  BP 132/68 (BP Location: Left Arm)   Pulse (!) 58   Temp 99.2 F (37.3 C) (Oral)   Resp 16   Ht 5\' 6"  (1.676 m)   Wt 59.8 kg   SpO2 100%   BMI 21.28 kg/m  Soft, mildly distended, appropriately  tender, JP with SS Output, still draining around entry site   Patient  s/p robotic cholecystectomy,JP drain placement for gangrenous cholecystitis with perforation. PRN pain med Soft diet  Colace  Zosyn  for 5 days post op can end 5/7 JP in place, monitor drainage  Hopefully home tomorrow  Future Appointments  Date Time Provider Department Center  09/25/2023 10:30 AM Awilda Bogus, MD [facility] None     Deena Farrier, MD [facility] [address] [place], [place] [address] [phone] (office)

## 2023-09-12 DIAGNOSIS — I10 Essential (primary) hypertension: Secondary | ICD-10-CM | POA: Diagnosis not present

## 2023-09-12 DIAGNOSIS — A419 Sepsis, unspecified organism: Secondary | ICD-10-CM | POA: Diagnosis not present

## 2023-09-12 DIAGNOSIS — K810 Acute cholecystitis: Secondary | ICD-10-CM | POA: Diagnosis not present

## 2023-09-12 MED ORDER — ASPIRIN 81 MG PO TBEC
81.0000 mg | DELAYED_RELEASE_TABLET | Freq: Every day | ORAL | Status: DC
  Administered 2023-09-12: 81 mg via ORAL
  Filled 2023-09-12: qty 1

## 2023-09-12 NOTE — Discharge Summary (Signed)
 Physician Discharge Summary  Antonio Hughes ZOX:[id number] DOB: [birth year] DOA: 09/05/2023  PCP: Wyvonna Heidelberg, MD Surgeon: Collene Dawson Admit date: 09/05/2023 Discharge date: 09/12/2023  Admitted From:  Home  Disposition: Home   Recommendations for Outpatient Follow-up:  Follow up with PCP in 1 weeks Follow up with Dr. Collene Dawson on 09/25/23 for staple removal, wound check, postop  Discharge Condition: STABLE   CODE STATUS: FULL DIET: soft foods, advance as tolerated    Brief Hospitalization Summary: Please see all hospital notes, images, labs for full details of the hospitalization. Admission provider HPI:  78 y.o. male with medical history significant for hypertension, prostate cancer.  Patient presented to the ED with complaints of generalized weakness, poor oral intake, right upper quadrant pain over the past 4 days.  Patient admitted with sepsis secondary to acute cholecystitis with noted CBD dilation and MRCP does not show any signs of choledocholithiasis.  General surgery consulted and patient underwent robotic assisted laparoscopic cholecystectomy and was noted to have acute gangrenous cholecystitis with perforation on 5/2.  Dietary advancement to clear liquids per general surgery and NG tube removed on 5/6.  Continues to require IV Zosyn  through 5/7.  Anticipate discharge in the next 48 hours.   Hospital Course by listed problems addressed  Acute gangrenous cholecystitis status post robotic laparoscopic cholecystectomy - Status post robotic assisted laparoscopic cholecystectomy with noted acute gangrenous cholecystitis with perforation on 5/2 - clinically improving daily  - advancing diet and tolerating diet; advance as tolerated  - NG tube removed 5/6 - discussed with surgeon, DC home today - follow up with Dr. Collene Dawson on 09/25/23 for staple removal    Sepsis-resolved  -Afebrile now. -Completed IV Zosyn  through 5/7 -Blood cultures with no growth to date  -Urine cultures  with noted isolated E. coli species; sensitive to current antibiotics.   Prostate cancer -Status post prostatectomy. -Continue outpatient follow-up with urology/oncology service. -Appears to be in remission.   HTN (hypertension) -resume home BP meds at discharge    Discharge Diagnoses:  Principal Problem:   Acute gangrenous cholecystitis Active Problems:   HTN (hypertension)   Prostate cancer (HCC)   Sepsis (HCC)   Dilated cbd, acquired   Cholecystitis with perforation of gallbladder   Discharge Instructions:  Allergies as of 09/12/2023   No Known Allergies      Medication List     TAKE these medications    aspirin 81 MG tablet Take 81 mg by mouth daily.   chlorthalidone  25 MG tablet Commonly known as: HYGROTON  Take 12.5 mg by mouth every other day.   diltiazem  360 MG 24 hr capsule Commonly known as: CARDIZEM  CD Take 360 mg by mouth daily.   DSS 100 MG Caps Take 100 mg by mouth daily.   losartan 25 MG tablet Commonly known as: COZAAR Take 25 mg by mouth daily.   MEMORY COMPLEX BRAIN HEALTH PO Take 1 tablet by mouth daily. Takes International aid/development worker by [employer]   MULTIVITAMIN MEN 50+ PO Take 1 tablet by mouth daily.   potassium chloride  10 MEQ tablet Commonly known as: KLOR-CON  M Take 10 mEq by mouth daily.        Follow-up Information     Fanta, Nancee Awe, MD Follow up in 1 week(s).   Specialty: Internal Medicine Why: Hospital Follow Up Contact information: [address] [place] [address] [phone]         Awilda Bogus, MD Follow up.   Specialty: General Surgery Why: As  needed Contact information: [address] [place] [facility] [address] [phone]                No Known Allergies Allergies as of 09/12/2023   No Known Allergies      Medication List     TAKE these medications    aspirin 81 MG tablet Take 81 mg by mouth daily.   chlorthalidone  25 MG tablet Commonly  known as: HYGROTON  Take 12.5 mg by mouth every other day.   diltiazem  360 MG 24 hr capsule Commonly known as: CARDIZEM  CD Take 360 mg by mouth daily.   DSS 100 MG Caps Take 100 mg by mouth daily.   losartan 25 MG tablet Commonly known as: COZAAR Take 25 mg by mouth daily.   MEMORY COMPLEX BRAIN HEALTH PO Take 1 tablet by mouth daily. Takes International aid/development worker by [employer]   MULTIVITAMIN MEN 50+ PO Take 1 tablet by mouth daily.   potassium chloride  10 MEQ tablet Commonly known as: KLOR-CON  M Take 10 mEq by mouth daily.        Procedures/Studies: DG Abd 1 View Result Date: 09/09/2023 CLINICAL DATA:  Check gastric catheter placement EXAM: ABDOMEN - 1 VIEW COMPARISON:  None Available. FINDINGS: Gastric catheter is noted coiled within the stomach. Scattered mildly dilated small bowel loops are again seen. IMPRESSION: Gastric catheter within the stomach. Electronically Signed   By: Violeta Grey M.D.   On: 09/09/2023 20:42   DG Chest Port 1 View Result Date: 09/07/2023 CLINICAL DATA:  Status post nasogastric tube placement. Status post cholecystectomy yesterday. EXAM: PORTABLE CHEST 1 VIEW COMPARISON:  10/27/2022 FINDINGS: Borderline enlarged heart. Interval small amount of patchy and linear density at the left lung base and small amount of linear density at the right lung base interval nasogastric tube extending into the stomach with its tip and side hole in the proximal to mid stomach. Lumbar spine degenerative changes and mild scoliosis. Right mid to upper abdomen surgical drain. Mildly dilated gas-filled small bowel and colon, compatible with postoperative ileus. IMPRESSION: 1. Interval nasogastric tube extending into the stomach with its tip and side hole in the proximal to mid stomach. 2. Interval small amount of bibasilar atelectasis. 3. Mild postoperative ileus. Electronically Signed   By: Catherin Closs M.D.   On: 09/07/2023 17:10   MR ABDOMEN MRCP W WO CONTAST Result  Date: 09/06/2023 CLINICAL DATA:  Abdominal pain, elevated T. bili, CT shows CBD dilatation and suggest cholecystitis. EXAM: MRI ABDOMEN WITHOUT AND WITH CONTRAST (INCLUDING MRCP) TECHNIQUE: Multiplanar multisequence MR imaging of the abdomen was performed both before and after the administration of intravenous contrast. Heavily T2-weighted images of the biliary and pancreatic ducts were obtained, and three-dimensional MRCP images were rendered by post processing. CONTRAST:  6mL GADAVIST GADOBUTROL 1 MMOL/ML IV SOLN COMPARISON:  CT scan abdomen and pelvis from 09/05/2023. FINDINGS: Lower chest: Unremarkable MR appearance to the lung bases. No pleural effusion. No pericardial effusion. Normal heart size. Hepatobiliary: The liver is normal in size and configuration. There is a single subcapsular 5 mm cyst noted in the right hepatic lobe, segment 7. No intra or extrahepatic bile duct dilation. No choledocholithiasis. The gallbladder is distended. There is diffuse moderate to marked gallbladder wall swelling/edema and pericholecystic fat stranding/free fluid. There are several areas of outpouching from the gallbladder lumen (series 16, images 44 and 48), which are nonspecific but concerning for contained perforation versus gallbladder diverticula. There are moderate volume gallstones including a gallstone in the neck.  Pancreas: No mass, inflammatory changes or other parenchymal abnormality identified. No main pancreatic duct dilation. Spleen:  Within normal limits in size and appearance. No focal mass. Adrenals/Urinary Tract: Unremarkable adrenal glands. No hydroureteronephrosis. No suspicious renal mass. There are at least 2 simple cysts noted in the left kidney. Stomach/Bowel: Visualized portions within the abdomen are unremarkable. No disproportionate dilation of bowel loops. Vascular/Lymphatic: No pathologically enlarged lymph nodes identified. No abdominal aortic aneurysm demonstrated. No ascites. Other: There is  trace amount of ascites in the right paracolic gutter. Musculoskeletal: No suspicious bone lesions identified. IMPRESSION: 1. Findings favor acute cholecystitis with several areas of outpouching from the gallbladder lumen, which are nonspecific but concerning for contained perforation versus gallbladder diverticula. 2. No choledocholithiasis or bile duct dilation. 3. Multiple other nonacute observations, as described above. Electronically Signed   By: Beula [place] M.D.   On: 09/06/2023 13:24   MR 3D Recon At Scanner Result Date: 09/06/2023 CLINICAL DATA:  Abdominal pain, elevated T. bili, CT shows CBD dilatation and suggest cholecystitis. EXAM: MRI ABDOMEN WITHOUT AND WITH CONTRAST (INCLUDING MRCP) TECHNIQUE: Multiplanar multisequence MR imaging of the abdomen was performed both before and after the administration of intravenous contrast. Heavily T2-weighted images of the biliary and pancreatic ducts were obtained, and three-dimensional MRCP images were rendered by post processing. CONTRAST:  6mL GADAVIST GADOBUTROL 1 MMOL/ML IV SOLN COMPARISON:  CT scan abdomen and pelvis from 09/05/2023. FINDINGS: Lower chest: Unremarkable MR appearance to the lung bases. No pleural effusion. No pericardial effusion. Normal heart size. Hepatobiliary: The liver is normal in size and configuration. There is a single subcapsular 5 mm cyst noted in the right hepatic lobe, segment 7. No intra or extrahepatic bile duct dilation. No choledocholithiasis. The gallbladder is distended. There is diffuse moderate to marked gallbladder wall swelling/edema and pericholecystic fat stranding/free fluid. There are several areas of outpouching from the gallbladder lumen (series 16, images 44 and 48), which are nonspecific but concerning for contained perforation versus gallbladder diverticula. There are moderate volume gallstones including a gallstone in the neck. Pancreas: No mass, inflammatory changes or other parenchymal abnormality  identified. No main pancreatic duct dilation. Spleen:  Within normal limits in size and appearance. No focal mass. Adrenals/Urinary Tract: Unremarkable adrenal glands. No hydroureteronephrosis. No suspicious renal mass. There are at least 2 simple cysts noted in the left kidney. Stomach/Bowel: Visualized portions within the abdomen are unremarkable. No disproportionate dilation of bowel loops. Vascular/Lymphatic: No pathologically enlarged lymph nodes identified. No abdominal aortic aneurysm demonstrated. No ascites. Other: There is trace amount of ascites in the right paracolic gutter. Musculoskeletal: No suspicious bone lesions identified. IMPRESSION: 1. Findings favor acute cholecystitis with several areas of outpouching from the gallbladder lumen, which are nonspecific but concerning for contained perforation versus gallbladder diverticula. 2. No choledocholithiasis or bile duct dilation. 3. Multiple other nonacute observations, as described above. Electronically Signed   By: Beula [place] M.D.   On: 09/06/2023 13:24   CT ABDOMEN PELVIS W CONTRAST Result Date: 09/05/2023 CLINICAL DATA:  Anorexia and weakness. EXAM: CT ABDOMEN AND PELVIS WITH CONTRAST TECHNIQUE: Multidetector CT imaging of the abdomen and pelvis was performed using the standard protocol following bolus administration of intravenous contrast. RADIATION DOSE REDUCTION: This exam was performed according to the departmental dose-optimization program which includes automated exposure control, adjustment of the mA and/or kV according to patient size and/or use of iterative reconstruction technique. CONTRAST:  OMNIPAQUE  IOHEXOL  300 MG/ML  SOLN COMPARISON:  09/01/2009. FINDINGS: Lower chest: No acute abnormality.  No pleural or pericardial effusions. Hepatobiliary: Pericholecystic fluid and the CBD dilatation. No intrahepatic biliary ductal dilatation. No hepatic parenchymal abnormalities. Pancreas: Unremarkable. No pancreatic ductal dilatation  or surrounding inflammatory changes. Spleen: Normal in size without focal abnormality. Adrenals/Urinary Tract: No adrenal lesions. There are couple of mm sized hypodensities in left kidney consistent with cyst. No nephrolithiasis or hydronephrosis. Unremarkable urinary bladder. Stomach/Bowel: Stomach is within normal limits. Appendix appears normal. No evidence of bowel wall thickening, distention, or inflammatory changes. Vascular/Lymphatic: Aortic atherosclerosis. No enlarged abdominal or pelvic lymph nodes. Reproductive: Postop prostatectomy. Other: No abdominal wall hernia or abnormality. No abdominopelvic ascites. Musculoskeletal: Thoracolumbosacral degenerative changes. IMPRESSION: 1. Pericholecystic fluid and CBD dilatation. Recommend ultrasound evaluation. 2. Left renal milimeter-sized cysts. 3. Aortic atherosclerosis (ICD10-I70.0). Electronically Signed   By: Sydell Eva M.D.   On: 09/05/2023 19:22   DG Abdomen 1 View Result Date: 09/05/2023 CLINICAL DATA:  Abdominal pain and constipation EXAM: ABDOMEN - 1 VIEW COMPARISON:  CT pelvis 09/01/2009 FINDINGS: Formed stool in the rectal vault with gas in the sigmoid colon, descending colon, distal transverse colon. Paucity of bowel gas in the right abdomen. Clustered loops of small bowel in the central to left abdomen measure up to 3 cm in diameter, borderline dilated. Levoconvex lumbar scoliosis with rotary component. Lumbar spondylosis with loss of intervertebral disc height in the upper lumbar spine. Suspected spurring along the sacroiliac joints. IMPRESSION: 1. Formed stool in the rectal vault with gas in the sigmoid colon, descending colon, and distal transverse colon. 2. Clustered loops of small bowel in the central to left abdomen measure up to 3 cm in diameter, borderline dilated. This is nonspecific and could be due to ileus or partial small bowel obstruction. 3. Levoconvex lumbar scoliosis with rotary component. Lumbar spondylosis. Electronically  Signed   By: Freida Jes M.D.   On: 09/05/2023 17:19     Subjective: Pt says he feels well, no complaints, tolerating diet well, JP removed by surgeon today.   Discharge Exam: Vitals:   09/12/23 1249 09/12/23 1316  BP: 124/73 115/69  Pulse: 77 72  Resp:  18  Temp:  98 F (36.7 C)  SpO2:  99%   Vitals:   09/11/23 2152 09/12/23 0512 09/12/23 1249 09/12/23 1316  BP: 122/74 127/77 124/73 115/69  Pulse: 73 75 77 72  Resp: 18 18  18   Temp: 98.2 F (36.8 C) 98.1 F (36.7 C)  98 F (36.7 C)  TempSrc: Oral Oral    SpO2: 100% 100%  99%  Weight:      Height:       General: Pt is alert, awake, not in acute distress Cardiovascular: normal S1/S2 +, no rubs, no gallops Respiratory: CTA bilaterally, no wheezing, no rhonchi Abdominal: Soft, NT, ND, bowel sounds + Extremities: no edema, no cyanosis   The results of significant diagnostics from this hospitalization (including imaging, microbiology, ancillary and laboratory) are listed below for reference.     Microbiology: Recent Results (from the past 240 hours)  Urine Culture (for pregnant, neutropenic or urologic patients or patients with an indwelling urinary catheter)     Status: Abnormal   Collection Time: 09/05/23  7:45 PM   Specimen: Urine, Clean Catch  Result Value Ref Range Status   Specimen Description   Final    URINE, CLEAN CATCH Performed at [facility], [address], [place], [place] [address]    Special Requests   Final    NONE Performed at [facility], [address]  [place], [place] [address]    Culture 60,000 COLONIES/mL KLEBSIELLA PNEUMONIAE (A)  Final   Report Status 09/08/2023 FINAL  Final   Organism ID, Bacteria KLEBSIELLA PNEUMONIAE (A)  Final      Susceptibility   Klebsiella pneumoniae - MIC*    AMPICILLIN >=32 RESISTANT Resistant     CEFAZOLIN  <=4 SENSITIVE Sensitive     CEFEPIME <=0.12 SENSITIVE Sensitive     CEFTRIAXONE <=0.25 SENSITIVE Sensitive     CIPROFLOXACIN <=0.25 SENSITIVE  Sensitive     GENTAMICIN <=1 SENSITIVE Sensitive     IMIPENEM <=0.25 SENSITIVE Sensitive     NITROFURANTOIN <=16 SENSITIVE Sensitive     TRIMETH /SULFA  <=20 SENSITIVE Sensitive     AMPICILLIN/SULBACTAM 16 INTERMEDIATE Intermediate     PIP/TAZO <=4 SENSITIVE Sensitive ug/mL    * 60,000 COLONIES/mL KLEBSIELLA PNEUMONIAE  Culture, blood (Routine X 2) w Reflex to ID Panel     Status: None   Collection Time: 09/05/23 10:39 PM   Specimen: BLOOD  Result Value Ref Range Status   Specimen Description BLOOD BLOOD LEFT ARM  Final   Special Requests   Final    BOTTLES DRAWN AEROBIC AND ANAEROBIC Blood Culture adequate volume   Culture   Final    NO GROWTH 5 DAYS Performed at [facility], [address], [place], [place] [address]    Report Status 09/10/2023 FINAL  Final  Culture, blood (Routine X 2) w Reflex to ID Panel     Status: None   Collection Time: 09/05/23 10:40 PM   Specimen: BLOOD  Result Value Ref Range Status   Specimen Description BLOOD BLOOD LEFT HAND  Final   Special Requests   Final    BOTTLES DRAWN AEROBIC AND ANAEROBIC Blood Culture adequate volume   Culture   Final    NO GROWTH 5 DAYS Performed at [facility], [address], [place], [place] [address]    Report Status 09/10/2023 FINAL  Final     Labs: BNP (last 3 results) No results for input(s): "BNP" in the last 8760 hours. Basic Metabolic Panel: Recent Labs  Lab 09/07/23 0428 09/08/23 0432 09/09/23 0421 09/10/23 0551 09/11/23 0353  NA 138 139 143 144 141  K 4.1 3.8 3.6 3.6 3.6  CL 101 103 107 111 108  CO2 26 26 28 26 26   GLUCOSE 274* 186* 122* 97 111*  BUN 52* 58* 46* 35* 26*  CREATININE 1.80* 1.73* 1.21 1.06 1.01  CALCIUM 8.8* 9.3 8.8* 9.0 8.6*  MG 2.3 2.8*  --  2.2 2.1   Liver Function Tests: Recent Labs  Lab 09/07/23 0428 09/08/23 0432 09/09/23 0421 09/10/23 0551 09/11/23 0353  AST 144* 61* 37 63* 74*  ALT 123* 98* 68* 85* 104*  ALKPHOS 81 88 76 82 88  BILITOT 2.0* 1.2 0.9 1.9* 1.1  PROT  6.8 7.2 6.2* 6.2* 5.9*  ALBUMIN 2.6* 2.7* 2.3* 2.4* 2.3*   Recent Labs  Lab 09/05/23 1646  LIPASE 28   No results for input(s): "AMMONIA" in the last 168 hours. CBC: Recent Labs  Lab 09/05/23 1646 09/06/23 0502 09/07/23 0428 09/08/23 0432 09/09/23 0421 09/10/23 0551 09/11/23 0353  WBC 20.1*   < > 15.3* 15.8* 10.6* 10.7* 10.8*  NEUTROABS 17.4*  --   --   --  8.2*  --   --   HGB 14.5   < > 11.3* 12.9* 11.5* 11.3* 11.0*  HCT 41.0   < > 30.9* 35.5* 32.0* 32.6* 31.5*  MCV 79.2*   < >  79.8* 79.8* 81.2 81.9 83.1  PLT 239   < > 219 316 284 295 328   < > = values in this interval not displayed.   Cardiac Enzymes: No results for input(s): "CKTOTAL", "CKMB", "CKMBINDEX", "TROPONINI" in the last 168 hours. BNP: Invalid input(s): "POCBNP" CBG: Recent Labs  Lab 09/05/23 1615  GLUCAP 199*   D-Dimer No results for input(s): "DDIMER" in the last 72 hours. Hgb A1c No results for input(s): "HGBA1C" in the last 72 hours. Lipid Profile No results for input(s): "CHOL", "HDL", "LDLCALC", "TRIG", "CHOLHDL", "LDLDIRECT" in the last 72 hours. Thyroid  function studies No results for input(s): "TSH", "T4TOTAL", "T3FREE", "THYROIDAB" in the last 72 hours.  Invalid input(s): "FREET3" Anemia work up No results for input(s): "VITAMINB12", "FOLATE", "FERRITIN", "TIBC", "IRON", "RETICCTPCT" in the last 72 hours. Urinalysis    Component Value Date/Time   COLORURINE AMBER (A) 09/05/2023 1945   APPEARANCEUR HAZY (A) 09/05/2023 1945   LABSPEC >1.046 (H) 09/05/2023 1945   PHURINE 5.0 09/05/2023 1945   GLUCOSEU NEGATIVE 09/05/2023 1945   HGBUR NEGATIVE 09/05/2023 1945   BILIRUBINUR NEGATIVE 09/05/2023 1945   KETONESUR NEGATIVE 09/05/2023 1945   PROTEINUR 100 (A) 09/05/2023 1945   NITRITE POSITIVE (A) 09/05/2023 1945   LEUKOCYTESUR MODERATE (A) 09/05/2023 1945   Sepsis Labs Recent Labs  Lab 09/08/23 0432 09/09/23 0421 09/10/23 0551 09/11/23 0353  WBC 15.8* 10.6* 10.7* 10.8*    Microbiology Recent Results (from the past 240 hours)  Urine Culture (for pregnant, neutropenic or urologic patients or patients with an indwelling urinary catheter)     Status: Abnormal   Collection Time: 09/05/23  7:45 PM   Specimen: Urine, Clean Catch  Result Value Ref Range Status   Specimen Description   Final    URINE, CLEAN CATCH Performed at [facility], [address], [place], [place] [address]    Special Requests   Final    NONE Performed at [facility], [address] [place], [place] [address]    Culture 60,000 COLONIES/mL KLEBSIELLA PNEUMONIAE (A)  Final   Report Status 09/08/2023 FINAL  Final   Organism ID, Bacteria KLEBSIELLA PNEUMONIAE (A)  Final      Susceptibility   Klebsiella pneumoniae - MIC*    AMPICILLIN >=32 RESISTANT Resistant     CEFAZOLIN  <=4 SENSITIVE Sensitive     CEFEPIME <=0.12 SENSITIVE Sensitive     CEFTRIAXONE <=0.25 SENSITIVE Sensitive     CIPROFLOXACIN <=0.25 SENSITIVE Sensitive     GENTAMICIN <=1 SENSITIVE Sensitive     IMIPENEM <=0.25 SENSITIVE Sensitive     NITROFURANTOIN <=16 SENSITIVE Sensitive     TRIMETH /SULFA  <=20 SENSITIVE Sensitive     AMPICILLIN/SULBACTAM 16 INTERMEDIATE Intermediate     PIP/TAZO <=4 SENSITIVE Sensitive ug/mL    * 60,000 COLONIES/mL KLEBSIELLA PNEUMONIAE  Culture, blood (Routine X 2) w Reflex to ID Panel     Status: None   Collection Time: 09/05/23 10:39 PM   Specimen: BLOOD  Result Value Ref Range Status   Specimen Description BLOOD BLOOD LEFT ARM  Final   Special Requests   Final    BOTTLES DRAWN AEROBIC AND ANAEROBIC Blood Culture adequate volume   Culture   Final    NO GROWTH 5 DAYS Performed at [facility], [address], [place], [place] [address]    Report Status 09/10/2023 FINAL  Final  Culture, blood (Routine X 2) w Reflex to ID Panel     Status: None   Collection Time: 09/05/23 10:40 PM   Specimen:  BLOOD  Result Value Ref Range Status   Specimen Description BLOOD BLOOD LEFT HAND  Final    Special Requests   Final    BOTTLES DRAWN AEROBIC AND ANAEROBIC Blood Culture adequate volume   Culture   Final    NO GROWTH 5 DAYS Performed at [facility], [address], [place], [place] [address]    Report Status 09/10/2023 FINAL  Final   Time coordinating discharge: 34 mins  SIGNED:  Faustino Hook, MD  [facility] 09/12/2023, 1:37 PM How to contact the [facility] Attending or Consulting provider 7A - 7P or covering provider during after hours 7P -7A, for this patient?  Check the care team in [facility] and look for a) attending/consulting [facility] provider listed and b) the [facility] team listed Log into [email or web address] and use [place]'s universal password to access. If you do not have the password, please contact the hospital operator. Locate the [facility] provider you are looking for under [facility] and page to a number that you can be directly reached. If you still have difficulty reaching the provider, please page the [facility] (Director on Call) for the Hospitalists listed on amion for assistance.

## 2023-09-12 NOTE — Discharge Instructions (Signed)
 IMPORTANT INFORMATION: PAY CLOSE ATTENTION   PHYSICIAN DISCHARGE INSTRUCTIONS  Follow with Primary care provider  Benetta Spar, MD  and other consultants as instructed by your Hospitalist Physician  SEEK MEDICAL CARE OR RETURN TO EMERGENCY ROOM IF SYMPTOMS COME BACK, WORSEN OR NEW PROBLEM DEVELOPS   Please note: You were cared for by a hospitalist during your hospital stay. Every effort will be made to forward records to your primary care provider.  You can request that your primary care provider send for your hospital records if they have not received them.  Once you are discharged, your primary care physician will handle any further medical issues. Please note that NO REFILLS for any discharge medications will be authorized once you are discharged, as it is imperative that you return to your primary care physician (or establish a relationship with a primary care physician if you do not have one) for your post hospital discharge needs so that they can reassess your need for medications and monitor your lab values.  Please get a complete blood count and chemistry panel checked by your Primary MD at your next visit, and again as instructed by your Primary MD.  Get Medicines reviewed and adjusted: Please take all your medications with you for your next visit with your Primary MD  Laboratory/radiological data: Please request your Primary MD to go over all hospital tests and procedure/radiological results at the follow up, please ask your primary care provider to get all Hospital records sent to his/her office.  In some cases, they will be blood work, cultures and biopsy results pending at the time of your discharge. Please request that your primary care provider follow up on these results.  If you are diabetic, please bring your blood sugar readings with you to your follow up appointment with primary care.    Please call and make your follow up appointments as soon as possible.     Also Note the following: If you experience worsening of your admission symptoms, develop shortness of breath, life threatening emergency, suicidal or homicidal thoughts you must seek medical attention immediately by calling 911 or calling your MD immediately  if symptoms less severe.  You must read complete instructions/literature along with all the possible adverse reactions/side effects for all the Medicines you take and that have been prescribed to you. Take any new Medicines after you have completely understood and accpet all the possible adverse reactions/side effects.   Do not drive when taking Pain medications or sleeping medications (Benzodiazepines)  Do not take more than prescribed Pain, Sleep and Anxiety Medications. It is not advisable to combine anxiety,sleep and pain medications without talking with your primary care practitioner  Special Instructions: If you have smoked or chewed Tobacco  in the last 2 yrs please stop smoking, stop any regular Alcohol  and or any Recreational drug use.  Wear Seat belts while driving.  Do not drive if taking any narcotic, mind altering or controlled substances or recreational drugs or alcohol.

## 2023-09-12 NOTE — Plan of Care (Signed)
  Problem: Health Behavior/Discharge Planning: Goal: Ability to manage health-related needs will improve Outcome: Adequate for Discharge   Problem: Clinical Measurements: Goal: Respiratory complications will improve Outcome: Adequate for Discharge Goal: Cardiovascular complication will be avoided Outcome: Adequate for Discharge   Problem: Activity: Goal: Risk for activity intolerance will decrease Outcome: Adequate for Discharge

## 2023-09-12 NOTE — Progress Notes (Signed)
 [facility]  Doing well. JP has been putting out minimal. Old hematoma SS output. No signs of bile /green or suds. Eating and having Bms.   BP 115/69 (BP Location: Left Arm)   Pulse 72   Temp 98 F (36.7 C)   Resp 18   Ht 5\' 6"  (1.676 m)   Wt 59.8 kg   SpO2 99%   BMI 21.28 kg/m  Soft, non-distended, appropriately tender, staples c/d/I with no erythema or drainage JP removed, ss output, dressing placed  Patient s/p robotic cholecystectomy for acute gangrenous cholecystitis, completed antibiotics, JP removed. Ileus resolved.  Diet as tolerated Will get staples out 5/21 Dressing to remaining in place until 5/10 Ok to dc home.   Deena Farrier, MD [facility] [address] [place], [place] [address] [phone] (office)

## 2023-09-18 DIAGNOSIS — Z8546 Personal history of malignant neoplasm of prostate: Secondary | ICD-10-CM | POA: Diagnosis not present

## 2023-09-18 DIAGNOSIS — I10 Essential (primary) hypertension: Secondary | ICD-10-CM | POA: Diagnosis not present

## 2023-09-18 DIAGNOSIS — K82A1 Gangrene of gallbladder in cholecystitis: Secondary | ICD-10-CM | POA: Diagnosis not present

## 2023-09-18 DIAGNOSIS — A419 Sepsis, unspecified organism: Secondary | ICD-10-CM | POA: Diagnosis not present

## 2023-09-25 ENCOUNTER — Encounter: Payer: Self-pay | Admitting: General Surgery

## 2023-09-25 ENCOUNTER — Ambulatory Visit (INDEPENDENT_AMBULATORY_CARE_PROVIDER_SITE_OTHER): Admitting: General Surgery

## 2023-09-25 VITALS — BP 127/68 | HR 58 | Temp 98.1°F | Resp 14 | Ht 66.0 in | Wt 130.0 lb

## 2023-09-25 DIAGNOSIS — K810 Acute cholecystitis: Secondary | ICD-10-CM | POA: Diagnosis not present

## 2023-09-25 DIAGNOSIS — K8020 Calculus of gallbladder without cholecystitis without obstruction: Secondary | ICD-10-CM

## 2023-09-25 NOTE — Progress Notes (Signed)
 [facility]  Doing well. No major issues. Eating and having Bms.   BP 127/68   Pulse (!) 58   Temp 98.1 F (36.7 C) (Oral)   Resp 14   Ht 5\' 6"  (1.676 m)   Wt 130 lb (59 kg)   SpO2 97%   BMI 20.98 kg/m  Soft, nondistended, staples removed, no erythema or drainage, steri strips placed No signs of jaundice   Patient s/p robotic assisted cholecystectomy for gangrenous cholecystectomy. Doing well.  Repeat CMP  Steristrips will peel off in the next 5-7 days. You can remove them once they are peeling off. It is ok to shower. Pat the area dry.  Diet and activity as tolerated Get lab work today at [facility]. Will call with results  Deena Farrier, MD [facility] [address] [place], [place] [address] [phone] (office)

## 2023-09-25 NOTE — Patient Instructions (Signed)
 Steristrips will peel off in the next 5-7 days. You can remove them once they are peeling off. It is ok to shower. Pat the area dry.  Diet and activity as tolerated Get lab work today at [facility]. Will call with results

## 2023-09-26 ENCOUNTER — Ambulatory Visit (INDEPENDENT_AMBULATORY_CARE_PROVIDER_SITE_OTHER): Payer: Self-pay | Admitting: General Surgery

## 2023-09-26 DIAGNOSIS — K810 Acute cholecystitis: Secondary | ICD-10-CM

## 2023-09-26 LAB — COMPREHENSIVE METABOLIC PANEL WITH GFR
ALT: 26 IU/L (ref 0–44)
AST: 23 IU/L (ref 0–40)
Albumin: 4 g/dL (ref 3.8–4.8)
Alkaline Phosphatase: 96 IU/L (ref 44–121)
BUN/Creatinine Ratio: 10 (ref 10–24)
BUN: 11 mg/dL (ref 8–27)
Bilirubin Total: 0.3 mg/dL (ref 0.0–1.2)
CO2: 21 mmol/L (ref 20–29)
Calcium: 10 mg/dL (ref 8.6–10.2)
Chloride: 103 mmol/L (ref 96–106)
Creatinine, Ser: 1.06 mg/dL (ref 0.76–1.27)
Globulin, Total: 2.9 g/dL (ref 1.5–4.5)
Glucose: 92 mg/dL (ref 70–99)
Potassium: 4.4 mmol/L (ref 3.5–5.2)
Sodium: 140 mmol/L (ref 134–144)
Total Protein: 6.9 g/dL (ref 6.0–8.5)
eGFR: 72 mL/min/{1.73_m2} (ref >59)

## 2023-09-26 NOTE — Progress Notes (Signed)
 Let patient know his labwork is all back to normal.

## 2023-09-26 NOTE — Progress Notes (Signed)
 Tried to call patient no answer and mailbox is full.

## 2023-10-14 NOTE — Anesthesia Postprocedure Evaluation (Signed)
 Anesthesia Post Note  Patient: Antonio Hughes  Procedure(s) Performed: CHOLECYSTECTOMY, ROBOT-ASSISTED, LAPAROSCOPIC (Abdomen) LYSIS, ADHESIONS, LAPAROSCOPIC (Abdomen)  Patient location during evaluation: PACU Anesthesia Type: General Level of consciousness: awake and alert Pain management: pain level controlled Vital Signs Assessment: post-procedure vital signs reviewed and stable Respiratory status: spontaneous breathing, nonlabored ventilation, respiratory function stable and patient connected to nasal cannula oxygen Cardiovascular status: blood pressure returned to baseline and stable Postop Assessment: no apparent nausea or vomiting Anesthetic complications: no   There were no known notable events for this encounter.   Last Vitals:  Vitals:   09/12/23 1249 09/12/23 1316  BP: 124/73 115/69  Pulse: 77 72  Resp:  18  Temp:  36.7 C  SpO2:  99%    Last Pain:  Vitals:   09/12/23 1000  TempSrc:   PainSc: 0-No pain                 Tykeria Wawrzyniak L Marcques Wrightsman

## 2023-12-20 DIAGNOSIS — Z0001 Encounter for general adult medical examination with abnormal findings: Secondary | ICD-10-CM | POA: Diagnosis not present

## 2023-12-20 DIAGNOSIS — Z8673 Personal history of transient ischemic attack (TIA), and cerebral infarction without residual deficits: Secondary | ICD-10-CM | POA: Diagnosis not present

## 2023-12-20 DIAGNOSIS — I10 Essential (primary) hypertension: Secondary | ICD-10-CM | POA: Diagnosis not present

## 2023-12-20 DIAGNOSIS — Z1331 Encounter for screening for depression: Secondary | ICD-10-CM | POA: Diagnosis not present

## 2023-12-20 DIAGNOSIS — Z1389 Encounter for screening for other disorder: Secondary | ICD-10-CM | POA: Diagnosis not present

## 2023-12-20 DIAGNOSIS — Z8546 Personal history of malignant neoplasm of prostate: Secondary | ICD-10-CM | POA: Diagnosis not present

## 2024-01-09 DIAGNOSIS — A419 Sepsis, unspecified organism: Secondary | ICD-10-CM | POA: Diagnosis not present

## 2024-01-09 DIAGNOSIS — Z79899 Other long term (current) drug therapy: Secondary | ICD-10-CM | POA: Diagnosis not present

## 2024-01-09 DIAGNOSIS — I10 Essential (primary) hypertension: Secondary | ICD-10-CM | POA: Diagnosis not present
